# Patient Record
Sex: Female | Born: 1955 | Race: White | Hispanic: No | Smoking: Never smoker
Health system: Southern US, Community
[De-identification: ages and names within clinical notes are randomized; demographics above are authoritative.]

## PROBLEM LIST (undated history)

## (undated) DIAGNOSIS — E785 Hyperlipidemia, unspecified: Secondary | ICD-10-CM

## (undated) DIAGNOSIS — R011 Cardiac murmur, unspecified: Secondary | ICD-10-CM

## (undated) DIAGNOSIS — R202 Paresthesia of skin: Secondary | ICD-10-CM

## (undated) DIAGNOSIS — F32A Depression, unspecified: Secondary | ICD-10-CM

## (undated) DIAGNOSIS — I10 Essential (primary) hypertension: Secondary | ICD-10-CM

## (undated) DIAGNOSIS — R200 Anesthesia of skin: Secondary | ICD-10-CM

## (undated) DIAGNOSIS — E781 Pure hyperglyceridemia: Secondary | ICD-10-CM

## (undated) DIAGNOSIS — E039 Hypothyroidism, unspecified: Secondary | ICD-10-CM

## (undated) DIAGNOSIS — R7303 Prediabetes: Secondary | ICD-10-CM

## (undated) DIAGNOSIS — F329 Major depressive disorder, single episode, unspecified: Secondary | ICD-10-CM

## (undated) HISTORY — DX: Cardiac murmur, unspecified: R01.1

## (undated) HISTORY — DX: Prediabetes: R73.03

## (undated) HISTORY — DX: Hyperlipidemia, unspecified: E78.5

## (undated) HISTORY — DX: Anesthesia of skin: R20.2

## (undated) HISTORY — PX: COLONOSCOPY: SHX174

## (undated) HISTORY — DX: Hypothyroidism, unspecified: E03.9

## (undated) HISTORY — DX: Major depressive disorder, single episode, unspecified: F32.9

## (undated) HISTORY — DX: Anesthesia of skin: R20.0

## (undated) HISTORY — DX: Pure hyperglyceridemia: E78.1

## (undated) HISTORY — DX: Essential (primary) hypertension: I10

## (undated) HISTORY — DX: Depression, unspecified: F32.A

---
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
## 1997-10-12 ENCOUNTER — Other Ambulatory Visit: Admission: RE | Admit: 1997-10-12 | Discharge: 1997-10-12 | Payer: Self-pay | Admitting: Gynecology

## 1997-10-13 ENCOUNTER — Other Ambulatory Visit: Admission: RE | Admit: 1997-10-13 | Discharge: 1997-10-13 | Payer: Self-pay | Admitting: Gynecology

## 1997-12-29 ENCOUNTER — Encounter: Payer: Self-pay | Admitting: Gynecology

## 1997-12-29 ENCOUNTER — Ambulatory Visit (HOSPITAL_COMMUNITY): Admission: RE | Admit: 1997-12-29 | Discharge: 1997-12-29 | Payer: Self-pay | Admitting: Gynecology

## 1999-02-28 ENCOUNTER — Encounter: Payer: Self-pay | Admitting: Emergency Medicine

## 1999-03-01 ENCOUNTER — Inpatient Hospital Stay (HOSPITAL_COMMUNITY): Admission: AD | Admit: 1999-03-01 | Discharge: 1999-03-02 | Payer: Self-pay | Admitting: Cardiology

## 1999-03-01 ENCOUNTER — Encounter: Payer: Self-pay | Admitting: Cardiology

## 1999-03-02 ENCOUNTER — Encounter: Payer: Self-pay | Admitting: Cardiology

## 2001-04-01 ENCOUNTER — Other Ambulatory Visit: Admission: RE | Admit: 2001-04-01 | Discharge: 2001-04-01 | Payer: Self-pay | Admitting: Gynecology

## 2002-05-18 ENCOUNTER — Other Ambulatory Visit: Admission: RE | Admit: 2002-05-18 | Discharge: 2002-05-18 | Payer: Self-pay | Admitting: Gynecology

## 2003-06-06 ENCOUNTER — Other Ambulatory Visit: Admission: RE | Admit: 2003-06-06 | Discharge: 2003-06-06 | Payer: Self-pay | Admitting: Gynecology

## 2004-07-25 ENCOUNTER — Other Ambulatory Visit: Admission: RE | Admit: 2004-07-25 | Discharge: 2004-07-25 | Payer: Self-pay | Admitting: Gynecology

## 2005-08-05 ENCOUNTER — Other Ambulatory Visit: Admission: RE | Admit: 2005-08-05 | Discharge: 2005-08-05 | Payer: Self-pay | Admitting: Gynecology

## 2006-08-06 ENCOUNTER — Other Ambulatory Visit: Admission: RE | Admit: 2006-08-06 | Discharge: 2006-08-06 | Payer: Self-pay | Admitting: Gynecology

## 2007-03-26 ENCOUNTER — Ambulatory Visit: Payer: Self-pay | Admitting: Internal Medicine

## 2007-04-09 ENCOUNTER — Ambulatory Visit: Payer: Self-pay | Admitting: Internal Medicine

## 2007-09-29 ENCOUNTER — Other Ambulatory Visit: Admission: RE | Admit: 2007-09-29 | Discharge: 2007-09-29 | Payer: Self-pay | Admitting: Gynecology

## 2009-02-02 ENCOUNTER — Ambulatory Visit (HOSPITAL_COMMUNITY): Admission: RE | Admit: 2009-02-02 | Discharge: 2009-02-02 | Payer: Self-pay | Admitting: Urology

## 2010-04-07 LAB — CREATININE, SERUM
Creatinine, Ser: 0.88 mg/dL (ref 0.4–1.2)
GFR calc Af Amer: >60 mL/min (ref >60)
GFR calc non Af Amer: >60 mL/min (ref >60)

## 2010-06-07 NOTE — Discharge Summary (Signed)
[place]. [facility]  Patient:    Julia Hubbard, Julia Hubbard                     MRN: [id number] Adm. Date:  [id number] Disc. Date: [id number] Attending:  Robynn Pane CC:         Eduardo Osier. Sharyn Lull, M.D.                           Discharge Summary  ADMISSION DIAGNOSES: 1. Chest pain with mildly elevated troponin I, rule out questionable recent    small non-Q-wave myocardial infarction. 2. Depression. 3. Postmenopausal.  FINAL DIAGNOSES: 1. Chest pain with mildly elevated troponin I, possible recent small    non-Q-wave myocardial infarction. 2. Depression. 3. Postmenopausal. 4. Questionable possible recent small myocardial infarction.  DISCHARGE HOME MEDICATIONS: 1. Pepcid 20 mg 1 tablet twice daily. 2. Enteric-coated aspirin 1 tablet daily. 3. Effexor 75 mg p.o. b.i.d. 4. Pamelor 25 mg p.o. q.d. 5. Prempro 1 tablet daily. 6. Nitrostat sublingual 0.4 mg as needed.  CONDITION ON DISCHARGE:  Stable.  ACTIVITY:  As tolerated.  DIET:  Low salt, low cholesterol.  FOLLOW-UP:  With me in two weeks.  HISTORY OF PRESENT ILLNESS:  Ms. Julia Hubbard is a 55 year old white female with past medical history significant for depression.  Came to ER via EMS complaining of retrosternal chest pain radiating to back associated with diaphoresis and tingling in both hands and nausea.  The pain was sharp in nature, but was not related to breathing or movement.  Denies any relation of pain to food.  States had similar pain on Sunday which lasted all day, but did not seek any medical attention.  Denies palpitations, shortness of breath, lightheadedness, or syncope.  The patient denies any exertional chest pain. States she exercises at [facility] without any chest pain.  Denies fevers, chills, cough.  Denies abdominal pain.  PAST MEDICAL HISTORY:  No history of diabetes.  No history of hypertension, MI, no history of heart failure.  No history of peptic ulcer disease,  asthma.  PAST SURGICAL HISTORY:  She had tonsillectomy at the age of 87.  ALLERGIES:  No known drug allergies.  MEDICATIONS: 1. Effexor 75 mg p.o. b.i.d. 2. Pamelor 25 mg p.o. q.d. 3. Prempro 1 tablet daily.  SOCIAL HISTORY:  She is separated.  Drinks one to two glasses of wine socially.  No history of alcohol abuse.  She is a Archivist for speech pathology.  Born in [place], [place].  Lives in [place] for the last 22 years.  FAMILY HISTORY:  Father is 26.  He had colon cancer.  Mother is 37.  She is hypertensive.  She has three sisters in good health.  PHYSICAL EXAMINATION:  GENERAL:  The patient is alert, awake, oriented x 3.  In no acute distress.  VITAL SIGNS:  Hemodynamically stable.  HEENT:  Conjunctivae pink.  NECK:  Supple.  No JVD, no bruit.  LUNGS:  Clear to auscultation.  Without rhonchi or rales.  CARDIOVASCULAR:  S1, S2 was normal.  There was no S3, gallop, or murmur.  ABDOMEN:  Soft.  Bowel sounds are present.  Nontender.  There was no mass.  EXTREMITIES:  There was no clubbing, cyanosis, or edema.  LABORATORY DATA:  EKG showed normal sinus rhythm with no acute ischemic changes.  Chest x-ray showed no evidence of active disease.  Two sets of CPK, CK was 106,  MB of 0.7, with relative index of 0.7.  Second set 99 CK, MB of 0.8, with relative index of 0.8.  Troponin:  Both sets were slightly elevated at 0.14, second set was 0.12.  HOSPITAL COURSE:  The patient was admitted to telemetry unit.  The patient did not have any further episodes of chest pain during the hospital stay.  Her CKs, two sets were negative, although troponin was slightly elevated.  There was no evidence of recent chest trauma or any history of recent flu, but she did have chest pain approximately four or five days ago prior to this admission.  The patients EKGs remained essentially normal during the hospital stay.  The patient underwent stress Cardiolite on March 01, 1999, exercised for 8.58 minutes on Bruce protocol, had no chest pain.  The patient remained hemodynamically stable.  Peak heart rate was 149, peak blood pressure was 170/98.  EKG showed no evidence of ischemia at the peak of exercise or in the recovery phase.  Cardiolite scan also showed no evidence of ischemia.  The patient was discharged home in stable condition on the above medications, and will be followed up in my office in one to two weeks. DD:  04/10/99 TD:  04/10/99 Job: [id number]

## 2014-02-23 ENCOUNTER — Encounter: Payer: Self-pay | Admitting: Internal Medicine

## 2014-03-21 ENCOUNTER — Ambulatory Visit (AMBULATORY_SURGERY_CENTER): Payer: Self-pay

## 2014-03-21 VITALS — Ht 68.0 in | Wt 199.0 lb

## 2014-03-21 DIAGNOSIS — Z800 Family history of malignant neoplasm of digestive organs: Secondary | ICD-10-CM

## 2014-03-21 MED ORDER — MOVIPREP 100 G PO SOLR
ORAL | Status: DC
Start: 2014-03-21 — End: 2014-04-07

## 2014-03-21 NOTE — Progress Notes (Signed)
Per pt, no allergies to soy or egg products.Pt not taking any weight loss meds or using  O2 at home. 

## 2014-04-07 ENCOUNTER — Ambulatory Visit (AMBULATORY_SURGERY_CENTER): Payer: BLUE CROSS/BLUE SHIELD | Admitting: Internal Medicine

## 2014-04-07 ENCOUNTER — Encounter: Payer: Self-pay | Admitting: Internal Medicine

## 2014-04-07 VITALS — BP 105/65 | HR 57 | Temp 98.5°F | Resp 16 | Ht 68.0 in | Wt 199.0 lb

## 2014-04-07 DIAGNOSIS — Z800 Family history of malignant neoplasm of digestive organs: Secondary | ICD-10-CM

## 2014-04-07 DIAGNOSIS — D125 Benign neoplasm of sigmoid colon: Secondary | ICD-10-CM

## 2014-04-07 DIAGNOSIS — Z1211 Encounter for screening for malignant neoplasm of colon: Secondary | ICD-10-CM

## 2014-04-07 MED ORDER — SODIUM CHLORIDE 0.9 % IV SOLN: 500.0000 mL | INTRAVENOUS | Status: DC

## 2014-04-07 NOTE — Progress Notes (Signed)
Called to room to assist during endoscopic procedure.  Patient ID and intended procedure confirmed with present staff. Received instructions for my participation in the procedure from the performing physician.  

## 2014-04-07 NOTE — Progress Notes (Signed)
Report to PACU, RN, vss, BBS= Clear.  

## 2014-04-07 NOTE — Op Note (Signed)
[place]  [employer]. [place], [id number]   COLONOSCOPY PROCEDURE REPORT  PATIENT: Julia Hubbard, Julia Hubbard  MR#: [id number] BIRTHDATE: May 25, 1955 , 58  yrs. old GENDER: female ENDOSCOPIST: Lafayette Dragon, MD REFERRED ZC:HYIFO Addison Lank, M.D. PROCEDURE DATE:  04/07/2014 PROCEDURE:   Colonoscopy, screening and Colonoscopy with cold biopsy polypectomy First Screening Colonoscopy - Avg.  risk and is 50 yrs.  old or older - No.  Prior Negative Screening - Now for repeat screening. Less than 10 yrs Prior Negative Screening - Now for repeat screening.  Above average risk  History of Adenoma - Now for follow-up colonoscopy & has been > or = to 3 yrs.  N/A ASA CLASS:   Class I INDICATIONS:Screening for colonic neoplasia and FH Colon or Rectal Adenocarcinoma. ,father with colon cancer. Paternal grandfather and aunt with colon cancer area prior colonoscopies in 2002 and in March 2009 MEDICATIONS: Monitored anesthesia care and Propofol 300 mg IV  DESCRIPTION OF PROCEDURE:   After the risks benefits and alternatives of the procedure were thoroughly explained, informed consent was obtained.  The digital rectal exam revealed no abnormalities of the rectum.   The LB [id number]  endoscope was introduced through the anus and advanced to the cecum, which was identified by both the appendix and ileocecal valve. No adverse events experienced.   The quality of the prep was good.  (MoviPrep was used)  The instrument was then slowly withdrawn as the colon was fully examined.      COLON FINDINGS: A polypoid shaped sessile polyp measuring 7 mm in size was found in the sigmoid colon.  A polypectomy was performed with cold forceps.  The resection was complete, the polyp tissue was completely retrieved and sent to histology.  Retroflexed views revealed no abnormalities. The time to cecum = 5.38 Withdrawal time = 8.19   The scope was withdrawn and the procedure  completed. COMPLICATIONS: There were no immediate complications.  ENDOSCOPIC IMPRESSION: Sessile polyp was found in the sigmoid colon; polypectomy was performed with cold forceps  RECOMMENDATIONS: 1.  Await pathology results 2.  High-fiber diet Recall colonoscopy pending path report  eSigned:  Lafayette Dragon, MD 04/07/2014 8:30 AM   cc:

## 2014-04-07 NOTE — Patient Instructions (Signed)
YOU HAD AN ENDOSCOPIC PROCEDURE TODAY AT [facility]:   Refer to the procedure report that was given to you for any specific questions about what was found during the examination.  If the procedure report does not answer your questions, please call your gastroenterologist to clarify.  If you requested that your care partner not be given the details of your procedure findings, then the procedure report has been included in a sealed envelope for you to review at your convenience later.  YOU SHOULD EXPECT: Some feelings of bloating in the abdomen. Passage of more gas than usual.  Walking can help get rid of the air that was put into your GI tract during the procedure and reduce the bloating. If you had a lower endoscopy (such as a colonoscopy or flexible sigmoidoscopy) you may notice spotting of blood in your stool or on the toilet paper. If you underwent a bowel prep for your procedure, you may not have a normal bowel movement for a few days.  Please Note:  You might notice some irritation and congestion in your nose or some drainage.  This is from the oxygen used during your procedure.  There is no need for concern and it should clear up in a day or so.  SYMPTOMS TO REPORT IMMEDIATELY:   Following lower endoscopy (colonoscopy or flexible sigmoidoscopy):  Excessive amounts of blood in the stool  Significant tenderness or worsening of abdominal pains  Swelling of the abdomen that is new, acute  Fever of 100F or higher  For urgent or emergent issues, a gastroenterologist can be reached at any hour by calling [phone].   DIET: Your first meal following the procedure should be a small meal and then it is ok to progress to your normal diet. Heavy or fried foods are harder to digest and may make you feel nauseous or bloated.  Likewise, meals heavy in dairy and vegetables can increase bloating.  Drink plenty of fluids but you should avoid alcoholic beverages for 24  hours.  ACTIVITY:  You should plan to take it easy for the rest of today and you should NOT DRIVE or use heavy machinery until tomorrow (because of the sedation medicines used during the test).    FOLLOW UP: Our staff will call the number listed on your records the next business day following your procedure to check on you and address any questions or concerns that you may have regarding the information given to you following your procedure. If we do not reach you, we will leave a message.  However, if you are feeling well and you are not experiencing any problems, there is no need to return our call.  We will assume that you have returned to your regular daily activities without incident.  If any biopsies were taken you will be contacted by phone or by letter within the next 1-3 weeks.  Please call us at [phone] if you have not heard about the biopsies in 3 weeks.    SIGNATURES/CONFIDENTIALITY: You and/or your care partner have signed paperwork which will be entered into your electronic medical record.  These signatures attest to the fact that that the information above on your After Visit Summary has been reviewed and is understood.  Full responsibility of the confidentiality of this discharge information lies with you and/or your care-partner.  Await pathology  Continue your normal medications  Please read over handouts about polyps and high fiber diets  Please follow a high fiber diet

## 2014-04-10 ENCOUNTER — Telehealth: Payer: Self-pay | Admitting: *Deleted

## 2014-04-10 NOTE — Telephone Encounter (Signed)
  Follow up Call-  Call back number 04/07/2014  Post procedure Call Back phone  # [phone]  Permission to leave phone message Yes     Patient questions:  Do you have a fever, pain , or abdominal swelling? No. Pain Score  0 *  Have you tolerated food without any problems? Yes.    Have you been able to return to your normal activities? Yes.    Do you have any questions about your discharge instructions: Diet   No. Medications  No. Follow up visit  No.  Do you have questions or concerns about your Care? No.  Actions: * If pain score is 4 or above: No action needed, pain <4.

## 2014-04-11 ENCOUNTER — Encounter: Payer: Self-pay | Admitting: Internal Medicine

## 2015-03-16 ENCOUNTER — Other Ambulatory Visit (HOSPITAL_COMMUNITY): Payer: Self-pay | Admitting: Otolaryngology

## 2015-03-16 DIAGNOSIS — E079 Disorder of thyroid, unspecified: Secondary | ICD-10-CM

## 2015-03-21 ENCOUNTER — Ambulatory Visit (HOSPITAL_COMMUNITY)
Admission: RE | Admit: 2015-03-21 | Discharge: 2015-03-21 | Disposition: A | Discharge status: Home / Self Care | Payer: BLUE CROSS/BLUE SHIELD | Source: Ambulatory Visit | Attending: Otolaryngology | Admitting: Otolaryngology

## 2015-03-21 DIAGNOSIS — E079 Disorder of thyroid, unspecified: Secondary | ICD-10-CM

## 2015-03-28 ENCOUNTER — Other Ambulatory Visit (HOSPITAL_COMMUNITY): Payer: Self-pay | Admitting: Otolaryngology

## 2015-03-28 DIAGNOSIS — E041 Nontoxic single thyroid nodule: Secondary | ICD-10-CM

## 2015-04-06 ENCOUNTER — Ambulatory Visit (HOSPITAL_COMMUNITY): Admission: RE | Admit: 2015-04-06 | Payer: BLUE CROSS/BLUE SHIELD | Source: Ambulatory Visit

## 2015-07-02 ENCOUNTER — Ambulatory Visit: Payer: BLUE CROSS/BLUE SHIELD | Admitting: Gastroenterology

## 2015-09-26 DIAGNOSIS — Z0000 Encounter for general adult medical examination without abnormal findings: Secondary | ICD-10-CM | POA: Diagnosis not present

## 2015-09-26 DIAGNOSIS — R238 Other skin changes: Secondary | ICD-10-CM | POA: Diagnosis not present

## 2015-09-26 DIAGNOSIS — F331 Major depressive disorder, recurrent, moderate: Secondary | ICD-10-CM | POA: Diagnosis not present

## 2015-09-26 DIAGNOSIS — I10 Essential (primary) hypertension: Secondary | ICD-10-CM | POA: Diagnosis not present

## 2015-09-26 DIAGNOSIS — E782 Mixed hyperlipidemia: Secondary | ICD-10-CM | POA: Diagnosis not present

## 2015-10-17 DIAGNOSIS — Z23 Encounter for immunization: Secondary | ICD-10-CM | POA: Diagnosis not present

## 2015-11-13 ENCOUNTER — Other Ambulatory Visit: Payer: Self-pay | Admitting: Family Medicine

## 2015-11-13 DIAGNOSIS — Z23 Encounter for immunization: Secondary | ICD-10-CM | POA: Diagnosis not present

## 2015-11-13 DIAGNOSIS — Z01419 Encounter for gynecological examination (general) (routine) without abnormal findings: Secondary | ICD-10-CM | POA: Diagnosis not present

## 2015-11-13 DIAGNOSIS — Z0000 Encounter for general adult medical examination without abnormal findings: Secondary | ICD-10-CM | POA: Diagnosis not present

## 2015-11-13 DIAGNOSIS — Z124 Encounter for screening for malignant neoplasm of cervix: Secondary | ICD-10-CM | POA: Diagnosis not present

## 2015-11-13 DIAGNOSIS — Z1211 Encounter for screening for malignant neoplasm of colon: Secondary | ICD-10-CM | POA: Diagnosis not present

## 2015-11-13 DIAGNOSIS — E079 Disorder of thyroid, unspecified: Secondary | ICD-10-CM | POA: Diagnosis not present

## 2015-11-13 DIAGNOSIS — Z1231 Encounter for screening mammogram for malignant neoplasm of breast: Secondary | ICD-10-CM

## 2015-11-21 DIAGNOSIS — F331 Major depressive disorder, recurrent, moderate: Secondary | ICD-10-CM | POA: Diagnosis not present

## 2015-11-21 DIAGNOSIS — Z6829 Body mass index (BMI) 29.0-29.9, adult: Secondary | ICD-10-CM | POA: Diagnosis not present

## 2015-11-21 DIAGNOSIS — E039 Hypothyroidism, unspecified: Secondary | ICD-10-CM | POA: Diagnosis not present

## 2015-11-21 DIAGNOSIS — E559 Vitamin D deficiency, unspecified: Secondary | ICD-10-CM | POA: Diagnosis not present

## 2015-12-11 ENCOUNTER — Ambulatory Visit: Payer: BLUE CROSS/BLUE SHIELD

## 2016-01-02 ENCOUNTER — Ambulatory Visit
Admission: RE | Admit: 2016-01-02 | Discharge: 2016-01-02 | Disposition: A | Discharge status: Home / Self Care | Payer: BLUE CROSS/BLUE SHIELD | Source: Ambulatory Visit | Attending: Family Medicine | Admitting: Family Medicine

## 2016-01-02 DIAGNOSIS — Z1231 Encounter for screening mammogram for malignant neoplasm of breast: Secondary | ICD-10-CM | POA: Diagnosis not present

## 2016-01-29 DIAGNOSIS — E039 Hypothyroidism, unspecified: Secondary | ICD-10-CM | POA: Diagnosis not present

## 2016-01-29 DIAGNOSIS — E559 Vitamin D deficiency, unspecified: Secondary | ICD-10-CM | POA: Diagnosis not present

## 2016-01-31 DIAGNOSIS — E559 Vitamin D deficiency, unspecified: Secondary | ICD-10-CM | POA: Diagnosis not present

## 2016-01-31 DIAGNOSIS — E039 Hypothyroidism, unspecified: Secondary | ICD-10-CM | POA: Diagnosis not present

## 2016-01-31 DIAGNOSIS — I10 Essential (primary) hypertension: Secondary | ICD-10-CM | POA: Diagnosis not present

## 2016-01-31 DIAGNOSIS — Z6831 Body mass index (BMI) 31.0-31.9, adult: Secondary | ICD-10-CM | POA: Diagnosis not present

## 2016-04-04 DIAGNOSIS — J302 Other seasonal allergic rhinitis: Secondary | ICD-10-CM | POA: Diagnosis not present

## 2016-08-05 DIAGNOSIS — E039 Hypothyroidism, unspecified: Secondary | ICD-10-CM | POA: Diagnosis not present

## 2016-08-07 DIAGNOSIS — I10 Essential (primary) hypertension: Secondary | ICD-10-CM | POA: Diagnosis not present

## 2016-08-07 DIAGNOSIS — E039 Hypothyroidism, unspecified: Secondary | ICD-10-CM | POA: Diagnosis not present

## 2016-08-07 DIAGNOSIS — R21 Rash and other nonspecific skin eruption: Secondary | ICD-10-CM | POA: Diagnosis not present

## 2016-08-07 DIAGNOSIS — F331 Major depressive disorder, recurrent, moderate: Secondary | ICD-10-CM | POA: Diagnosis not present

## 2016-10-16 DIAGNOSIS — Z23 Encounter for immunization: Secondary | ICD-10-CM | POA: Diagnosis not present

## 2016-11-14 DIAGNOSIS — Z1329 Encounter for screening for other suspected endocrine disorder: Secondary | ICD-10-CM | POA: Diagnosis not present

## 2016-11-14 DIAGNOSIS — Z114 Encounter for screening for human immunodeficiency virus [HIV]: Secondary | ICD-10-CM | POA: Diagnosis not present

## 2016-11-14 DIAGNOSIS — Z13220 Encounter for screening for lipoid disorders: Secondary | ICD-10-CM | POA: Diagnosis not present

## 2016-11-14 DIAGNOSIS — Z0000 Encounter for general adult medical examination without abnormal findings: Secondary | ICD-10-CM | POA: Diagnosis not present

## 2016-11-17 ENCOUNTER — Other Ambulatory Visit: Payer: Self-pay | Admitting: Family Medicine

## 2016-11-17 DIAGNOSIS — I10 Essential (primary) hypertension: Secondary | ICD-10-CM | POA: Diagnosis not present

## 2016-11-17 DIAGNOSIS — Z1211 Encounter for screening for malignant neoplasm of colon: Secondary | ICD-10-CM | POA: Diagnosis not present

## 2016-11-17 DIAGNOSIS — E559 Vitamin D deficiency, unspecified: Secondary | ICD-10-CM | POA: Diagnosis not present

## 2016-11-17 DIAGNOSIS — Z23 Encounter for immunization: Secondary | ICD-10-CM | POA: Diagnosis not present

## 2016-11-17 DIAGNOSIS — Z0000 Encounter for general adult medical examination without abnormal findings: Secondary | ICD-10-CM | POA: Diagnosis not present

## 2016-11-17 DIAGNOSIS — E039 Hypothyroidism, unspecified: Secondary | ICD-10-CM | POA: Diagnosis not present

## 2016-11-17 DIAGNOSIS — Z6830 Body mass index (BMI) 30.0-30.9, adult: Secondary | ICD-10-CM | POA: Diagnosis not present

## 2016-11-17 DIAGNOSIS — Z1231 Encounter for screening mammogram for malignant neoplasm of breast: Secondary | ICD-10-CM

## 2016-12-04 ENCOUNTER — Ambulatory Visit: Payer: BLUE CROSS/BLUE SHIELD

## 2016-12-09 ENCOUNTER — Ambulatory Visit
Admission: RE | Admit: 2016-12-09 | Discharge: 2016-12-09 | Disposition: A | Discharge status: Home / Self Care | Payer: BLUE CROSS/BLUE SHIELD | Source: Ambulatory Visit | Attending: Family Medicine | Admitting: Family Medicine

## 2016-12-09 DIAGNOSIS — Z1231 Encounter for screening mammogram for malignant neoplasm of breast: Secondary | ICD-10-CM

## 2016-12-16 DIAGNOSIS — Z23 Encounter for immunization: Secondary | ICD-10-CM | POA: Diagnosis not present

## 2017-01-22 DIAGNOSIS — J040 Acute laryngitis: Secondary | ICD-10-CM | POA: Diagnosis not present

## 2017-01-22 DIAGNOSIS — J45909 Unspecified asthma, uncomplicated: Secondary | ICD-10-CM | POA: Diagnosis not present

## 2017-01-22 DIAGNOSIS — J029 Acute pharyngitis, unspecified: Secondary | ICD-10-CM | POA: Diagnosis not present

## 2017-01-22 DIAGNOSIS — J069 Acute upper respiratory infection, unspecified: Secondary | ICD-10-CM | POA: Diagnosis not present

## 2017-02-03 DIAGNOSIS — R946 Abnormal results of thyroid function studies: Secondary | ICD-10-CM | POA: Diagnosis not present

## 2017-02-03 DIAGNOSIS — E559 Vitamin D deficiency, unspecified: Secondary | ICD-10-CM | POA: Diagnosis not present

## 2017-02-12 DIAGNOSIS — Z6830 Body mass index (BMI) 30.0-30.9, adult: Secondary | ICD-10-CM | POA: Diagnosis not present

## 2017-02-12 DIAGNOSIS — J069 Acute upper respiratory infection, unspecified: Secondary | ICD-10-CM | POA: Diagnosis not present

## 2017-02-12 DIAGNOSIS — E039 Hypothyroidism, unspecified: Secondary | ICD-10-CM | POA: Diagnosis not present

## 2017-02-12 DIAGNOSIS — E559 Vitamin D deficiency, unspecified: Secondary | ICD-10-CM | POA: Diagnosis not present

## 2017-03-13 DIAGNOSIS — R21 Rash and other nonspecific skin eruption: Secondary | ICD-10-CM | POA: Diagnosis not present

## 2017-03-13 DIAGNOSIS — B354 Tinea corporis: Secondary | ICD-10-CM | POA: Diagnosis not present

## 2017-03-13 DIAGNOSIS — Z6831 Body mass index (BMI) 31.0-31.9, adult: Secondary | ICD-10-CM | POA: Diagnosis not present

## 2017-06-03 DIAGNOSIS — I10 Essential (primary) hypertension: Secondary | ICD-10-CM | POA: Diagnosis not present

## 2017-06-08 DIAGNOSIS — E559 Vitamin D deficiency, unspecified: Secondary | ICD-10-CM | POA: Diagnosis not present

## 2017-06-08 DIAGNOSIS — E039 Hypothyroidism, unspecified: Secondary | ICD-10-CM | POA: Diagnosis not present

## 2017-06-10 DIAGNOSIS — Z23 Encounter for immunization: Secondary | ICD-10-CM | POA: Diagnosis not present

## 2017-06-10 DIAGNOSIS — Z6829 Body mass index (BMI) 29.0-29.9, adult: Secondary | ICD-10-CM | POA: Diagnosis not present

## 2017-06-10 DIAGNOSIS — E559 Vitamin D deficiency, unspecified: Secondary | ICD-10-CM | POA: Diagnosis not present

## 2017-06-10 DIAGNOSIS — I10 Essential (primary) hypertension: Secondary | ICD-10-CM | POA: Diagnosis not present

## 2017-06-10 DIAGNOSIS — E039 Hypothyroidism, unspecified: Secondary | ICD-10-CM | POA: Diagnosis not present

## 2017-07-27 DIAGNOSIS — H00012 Hordeolum externum right lower eyelid: Secondary | ICD-10-CM | POA: Diagnosis not present

## 2017-10-13 DIAGNOSIS — Z23 Encounter for immunization: Secondary | ICD-10-CM | POA: Diagnosis not present

## 2017-11-12 ENCOUNTER — Other Ambulatory Visit: Payer: Self-pay | Admitting: Family Medicine

## 2017-11-12 DIAGNOSIS — Z1231 Encounter for screening mammogram for malignant neoplasm of breast: Secondary | ICD-10-CM

## 2017-11-18 DIAGNOSIS — Z1329 Encounter for screening for other suspected endocrine disorder: Secondary | ICD-10-CM | POA: Diagnosis not present

## 2017-11-18 DIAGNOSIS — Z13220 Encounter for screening for lipoid disorders: Secondary | ICD-10-CM | POA: Diagnosis not present

## 2017-11-18 DIAGNOSIS — Z0000 Encounter for general adult medical examination without abnormal findings: Secondary | ICD-10-CM | POA: Diagnosis not present

## 2017-11-18 DIAGNOSIS — E559 Vitamin D deficiency, unspecified: Secondary | ICD-10-CM | POA: Diagnosis not present

## 2017-11-18 DIAGNOSIS — Z114 Encounter for screening for human immunodeficiency virus [HIV]: Secondary | ICD-10-CM | POA: Diagnosis not present

## 2017-11-20 DIAGNOSIS — Z23 Encounter for immunization: Secondary | ICD-10-CM | POA: Diagnosis not present

## 2017-11-20 DIAGNOSIS — Z6829 Body mass index (BMI) 29.0-29.9, adult: Secondary | ICD-10-CM | POA: Diagnosis not present

## 2017-11-20 DIAGNOSIS — Z1211 Encounter for screening for malignant neoplasm of colon: Secondary | ICD-10-CM | POA: Diagnosis not present

## 2017-11-20 DIAGNOSIS — Z0000 Encounter for general adult medical examination without abnormal findings: Secondary | ICD-10-CM | POA: Diagnosis not present

## 2017-11-20 DIAGNOSIS — E559 Vitamin D deficiency, unspecified: Secondary | ICD-10-CM | POA: Diagnosis not present

## 2017-12-10 ENCOUNTER — Ambulatory Visit
Admission: RE | Admit: 2017-12-10 | Discharge: 2017-12-10 | Disposition: A | Discharge status: Home / Self Care | Payer: BLUE CROSS/BLUE SHIELD | Source: Ambulatory Visit | Attending: Family Medicine | Admitting: Family Medicine

## 2017-12-10 DIAGNOSIS — Z1231 Encounter for screening mammogram for malignant neoplasm of breast: Secondary | ICD-10-CM

## 2018-01-26 DIAGNOSIS — E039 Hypothyroidism, unspecified: Secondary | ICD-10-CM | POA: Diagnosis not present

## 2018-01-26 DIAGNOSIS — R946 Abnormal results of thyroid function studies: Secondary | ICD-10-CM | POA: Diagnosis not present

## 2018-01-29 DIAGNOSIS — Z6829 Body mass index (BMI) 29.0-29.9, adult: Secondary | ICD-10-CM | POA: Diagnosis not present

## 2018-01-29 DIAGNOSIS — E039 Hypothyroidism, unspecified: Secondary | ICD-10-CM | POA: Diagnosis not present

## 2018-01-29 DIAGNOSIS — R238 Other skin changes: Secondary | ICD-10-CM | POA: Diagnosis not present

## 2018-04-07 DIAGNOSIS — E039 Hypothyroidism, unspecified: Secondary | ICD-10-CM | POA: Diagnosis not present

## 2018-04-07 DIAGNOSIS — I10 Essential (primary) hypertension: Secondary | ICD-10-CM | POA: Diagnosis not present

## 2019-05-17 DIAGNOSIS — Z0000 Encounter for general adult medical examination without abnormal findings: Secondary | ICD-10-CM | POA: Diagnosis not present

## 2019-05-17 DIAGNOSIS — Z0184 Encounter for antibody response examination: Secondary | ICD-10-CM | POA: Diagnosis not present

## 2019-05-19 DIAGNOSIS — E782 Mixed hyperlipidemia: Secondary | ICD-10-CM | POA: Diagnosis not present

## 2019-05-19 DIAGNOSIS — E039 Hypothyroidism, unspecified: Secondary | ICD-10-CM | POA: Diagnosis not present

## 2019-05-19 DIAGNOSIS — Z1211 Encounter for screening for malignant neoplasm of colon: Secondary | ICD-10-CM | POA: Diagnosis not present

## 2019-05-19 DIAGNOSIS — Z01419 Encounter for gynecological examination (general) (routine) without abnormal findings: Secondary | ICD-10-CM | POA: Diagnosis not present

## 2019-05-19 DIAGNOSIS — R739 Hyperglycemia, unspecified: Secondary | ICD-10-CM | POA: Diagnosis not present

## 2019-05-19 DIAGNOSIS — I10 Essential (primary) hypertension: Secondary | ICD-10-CM | POA: Diagnosis not present

## 2019-05-19 DIAGNOSIS — Z0000 Encounter for general adult medical examination without abnormal findings: Secondary | ICD-10-CM | POA: Diagnosis not present

## 2019-05-19 DIAGNOSIS — Z6830 Body mass index (BMI) 30.0-30.9, adult: Secondary | ICD-10-CM | POA: Diagnosis not present

## 2019-06-01 ENCOUNTER — Other Ambulatory Visit: Payer: Self-pay | Admitting: Family Medicine

## 2019-06-01 DIAGNOSIS — Z1231 Encounter for screening mammogram for malignant neoplasm of breast: Secondary | ICD-10-CM

## 2019-06-06 ENCOUNTER — Ambulatory Visit: Payer: BLUE CROSS/BLUE SHIELD

## 2019-06-13 DIAGNOSIS — I10 Essential (primary) hypertension: Secondary | ICD-10-CM | POA: Diagnosis not present

## 2019-06-13 DIAGNOSIS — M170 Bilateral primary osteoarthritis of knee: Secondary | ICD-10-CM | POA: Diagnosis not present

## 2019-06-13 DIAGNOSIS — M19041 Primary osteoarthritis, right hand: Secondary | ICD-10-CM | POA: Diagnosis not present

## 2019-06-13 DIAGNOSIS — F331 Major depressive disorder, recurrent, moderate: Secondary | ICD-10-CM | POA: Diagnosis not present

## 2019-07-12 IMAGING — MG DIGITAL SCREENING BILATERAL MAMMOGRAM WITH TOMO AND CAD
6 of 10 series · 6 of 30 positions shown · non-contrast
Comparison: Previous exam(s).

ACR Breast Density Category a: The breast tissue is almost entirely
fatty.

CLINICAL DATA: Screening.

EXAM:
DIGITAL SCREENING BILATERAL MAMMOGRAM WITH TOMO AND CAD

[R CC synth-2D]
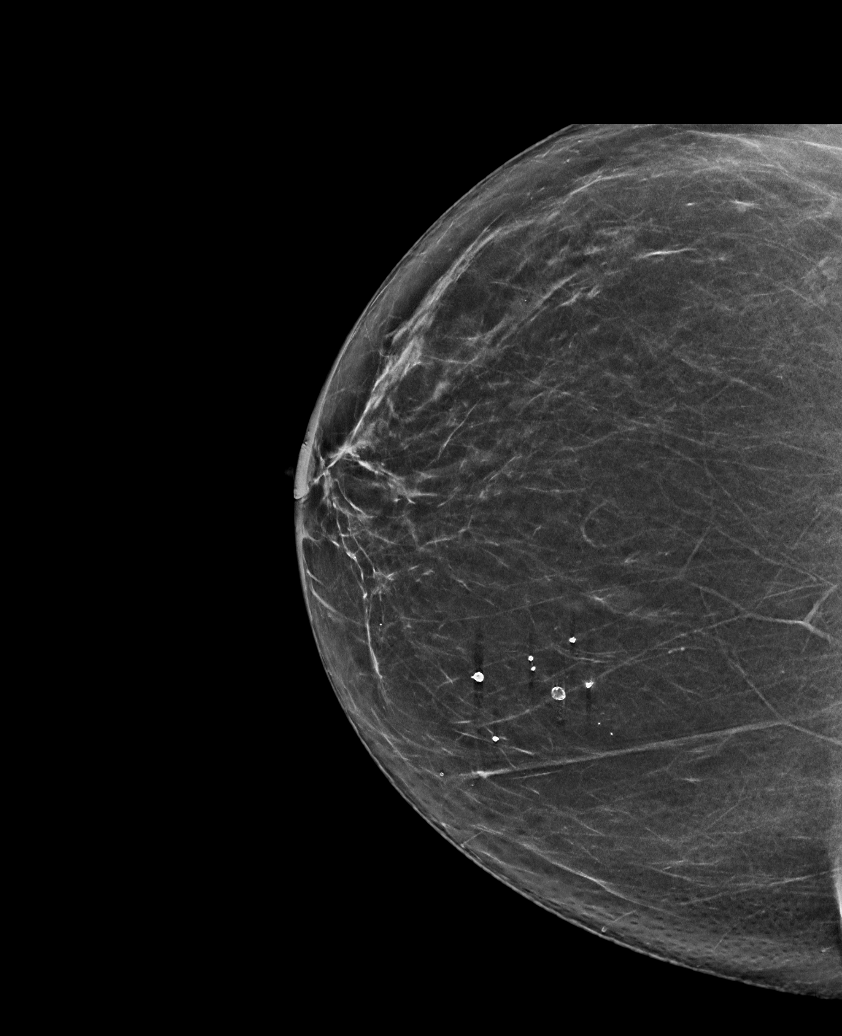

[L CC synth-2D]
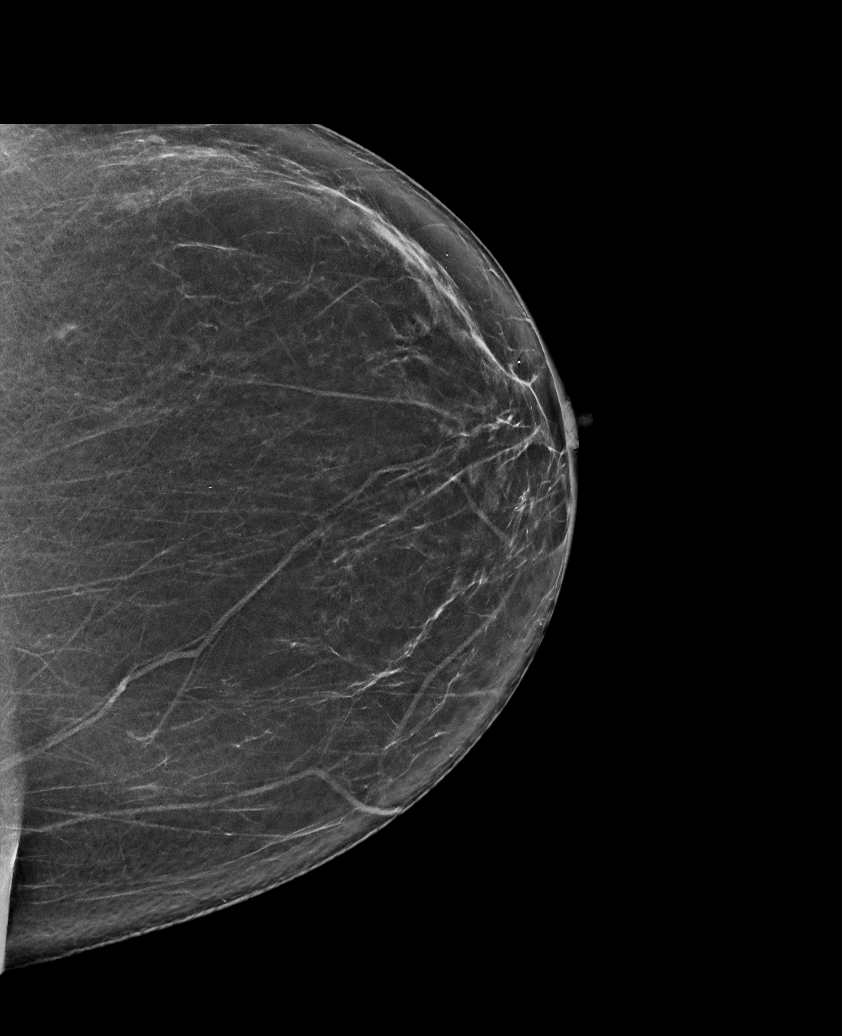

[R MLO synth-2D]
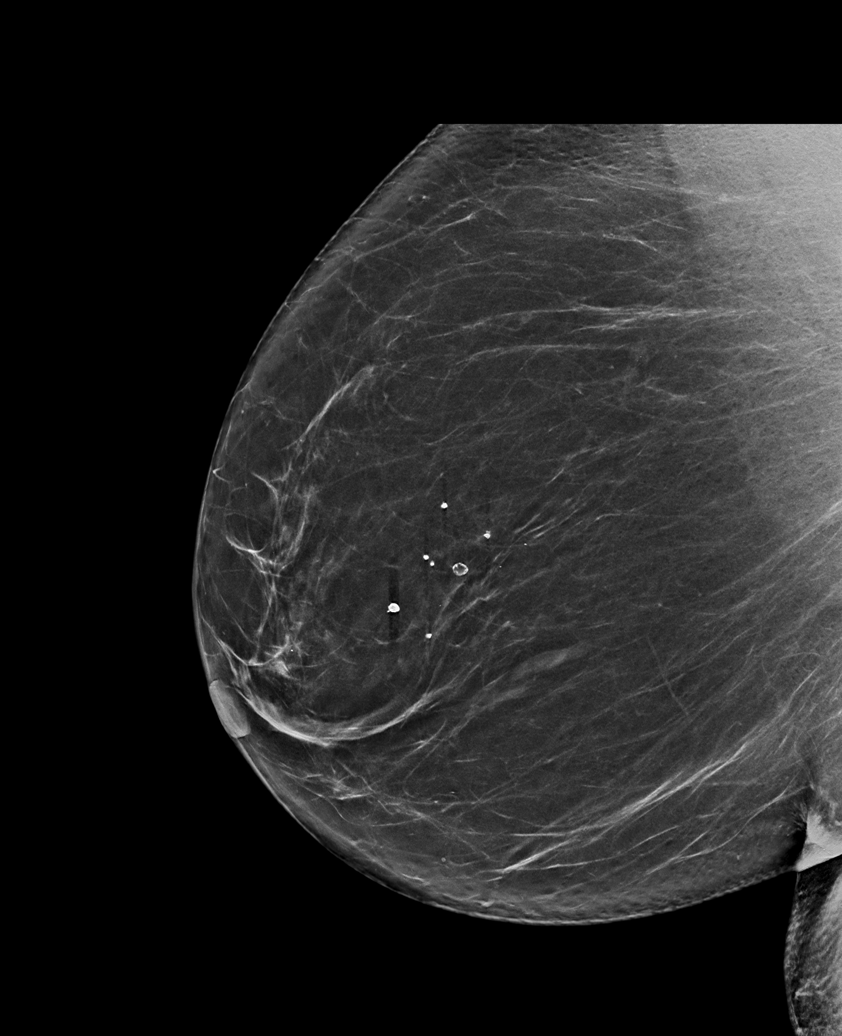

[L MLO synth-2D (1 of 2)]
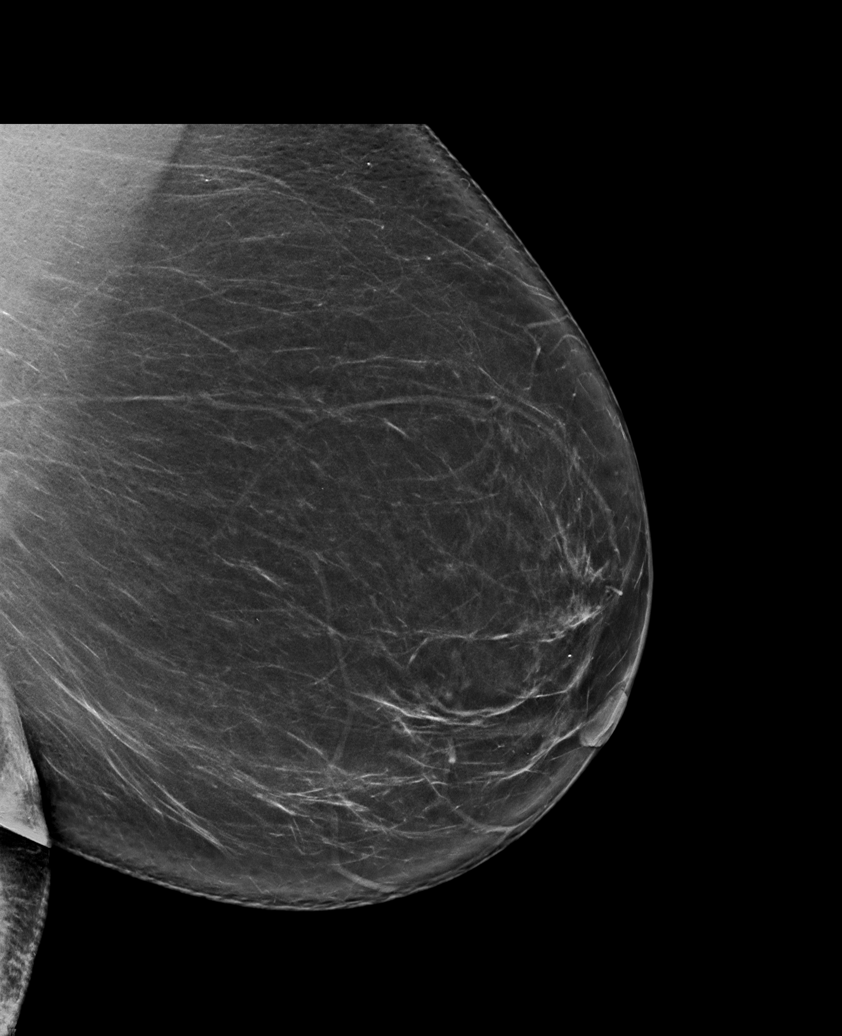

[L MLO synth-2D (2 of 2)]
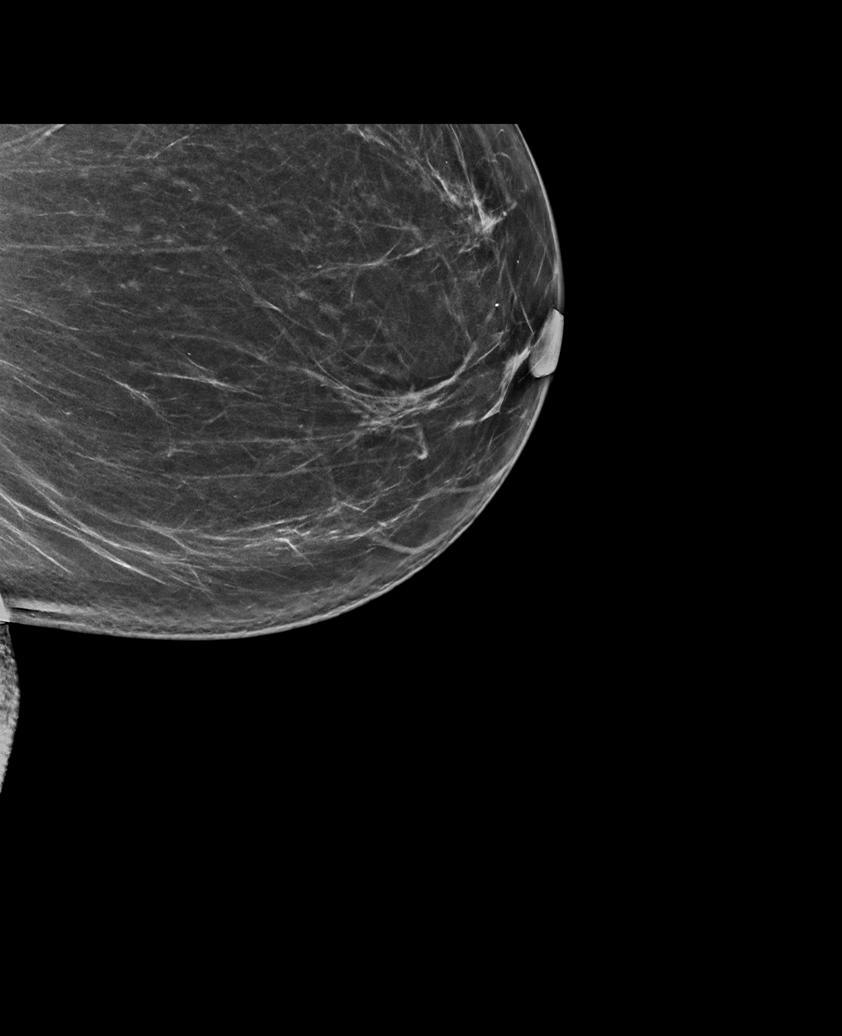

[R MLO tomo · tomo slice 44/87.0]
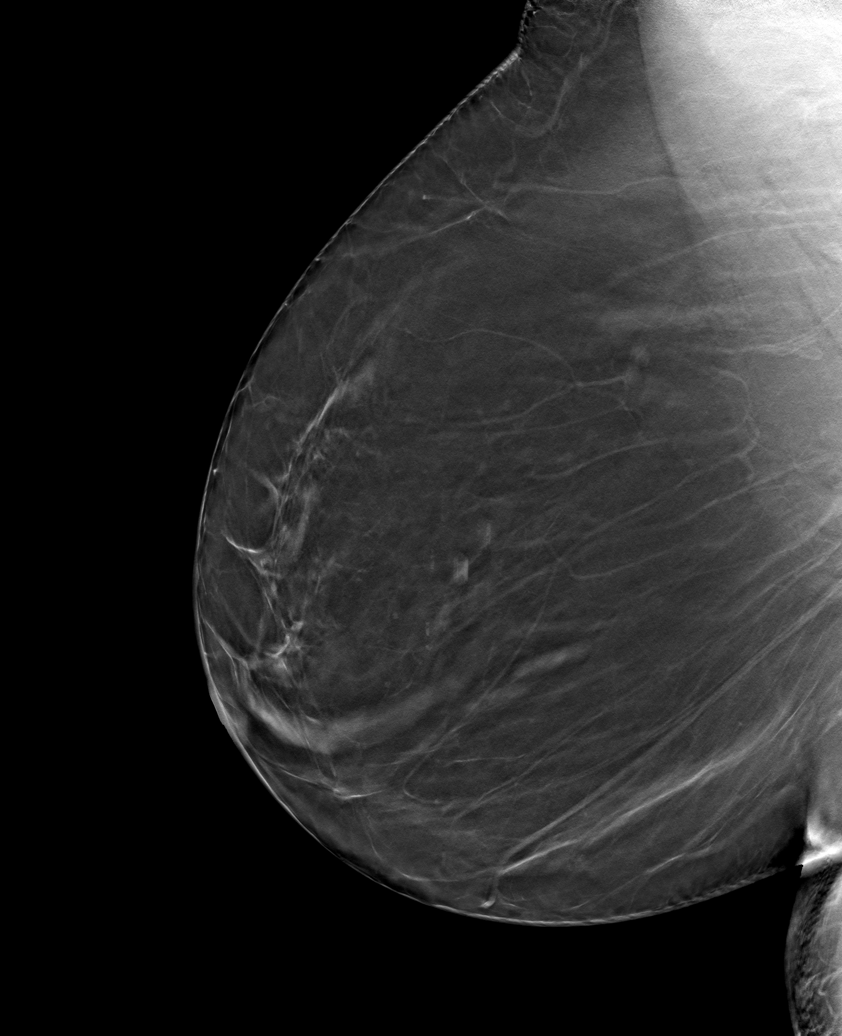

[6 of 30 positions shown; findings below may reference images not displayed]

FINDINGS: There are no findings suspicious for malignancy. Images were
processed with CAD.
IMPRESSION: No mammographic evidence of malignancy. A result letter of this
screening mammogram will be mailed directly to the patient.

RECOMMENDATION:
Screening mammogram in one year. (Code:[id number]-[id number])

BI-RADS CATEGORY  1: Negative.

## 2019-07-14 DIAGNOSIS — E782 Mixed hyperlipidemia: Secondary | ICD-10-CM | POA: Diagnosis not present

## 2019-07-14 DIAGNOSIS — E039 Hypothyroidism, unspecified: Secondary | ICD-10-CM | POA: Diagnosis not present

## 2019-07-18 DIAGNOSIS — I10 Essential (primary) hypertension: Secondary | ICD-10-CM | POA: Diagnosis not present

## 2019-07-18 DIAGNOSIS — E039 Hypothyroidism, unspecified: Secondary | ICD-10-CM | POA: Diagnosis not present

## 2019-07-18 DIAGNOSIS — E782 Mixed hyperlipidemia: Secondary | ICD-10-CM | POA: Diagnosis not present

## 2019-07-18 DIAGNOSIS — R7989 Other specified abnormal findings of blood chemistry: Secondary | ICD-10-CM | POA: Diagnosis not present

## 2019-08-05 ENCOUNTER — Other Ambulatory Visit: Payer: Self-pay

## 2019-08-05 ENCOUNTER — Ambulatory Visit (HOSPITAL_COMMUNITY)
Admission: EM | Admit: 2019-08-05 | Discharge: 2019-08-05 | Disposition: A | Discharge status: Home / Self Care | Payer: Self-pay | Attending: Emergency Medicine | Admitting: Emergency Medicine

## 2019-08-05 ENCOUNTER — Encounter (HOSPITAL_COMMUNITY): Payer: Self-pay | Admitting: Emergency Medicine

## 2019-08-05 DIAGNOSIS — H00025 Hordeolum internum left lower eyelid: Secondary | ICD-10-CM

## 2019-08-05 DIAGNOSIS — S161XXA Strain of muscle, fascia and tendon at neck level, initial encounter: Secondary | ICD-10-CM

## 2019-08-05 MED ORDER — ERYTHROMYCIN 5 MG/GM OP OINT
1.0000 | TOPICAL_OINTMENT | Freq: Four times a day (QID) | OPHTHALMIC | 0 refills | Status: AC
Start: 2019-08-05 — End: 2019-08-12
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
## 2019-08-05 NOTE — Discharge Instructions (Signed)
Exam is reassuring today. It would be expected to experience stiffness and soreness over the next few days with gradual improvement over the next few weeks.  Light and regular activity and stretching. Heat application while active to help with spasm or tightness.  Follow up with your PCP as needed for persistent symptoms.
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
## 2019-10-07 ENCOUNTER — Encounter: Payer: Self-pay | Admitting: Gastroenterology

## 2019-11-14 DIAGNOSIS — I10 Essential (primary) hypertension: Secondary | ICD-10-CM | POA: Diagnosis not present

## 2019-11-14 DIAGNOSIS — E782 Mixed hyperlipidemia: Secondary | ICD-10-CM | POA: Diagnosis not present

## 2019-11-14 DIAGNOSIS — E039 Hypothyroidism, unspecified: Secondary | ICD-10-CM | POA: Diagnosis not present

## 2019-11-17 DIAGNOSIS — I10 Essential (primary) hypertension: Secondary | ICD-10-CM | POA: Diagnosis not present

## 2019-11-17 DIAGNOSIS — E1165 Type 2 diabetes mellitus with hyperglycemia: Secondary | ICD-10-CM | POA: Diagnosis not present

## 2019-11-17 DIAGNOSIS — E782 Mixed hyperlipidemia: Secondary | ICD-10-CM | POA: Diagnosis not present

## 2019-11-17 DIAGNOSIS — E039 Hypothyroidism, unspecified: Secondary | ICD-10-CM | POA: Diagnosis not present

## 2019-12-02 ENCOUNTER — Encounter: Payer: Self-pay | Admitting: Gastroenterology

## 2020-04-10 ENCOUNTER — Other Ambulatory Visit: Payer: Self-pay | Admitting: Family Medicine

## 2020-04-10 DIAGNOSIS — Z1231 Encounter for screening mammogram for malignant neoplasm of breast: Secondary | ICD-10-CM

## 2020-06-07 ENCOUNTER — Other Ambulatory Visit: Payer: Self-pay

## 2020-06-07 ENCOUNTER — Ambulatory Visit
Admission: RE | Admit: 2020-06-07 | Discharge: 2020-06-07 | Disposition: A | Discharge status: Home / Self Care | Payer: 59 | Source: Ambulatory Visit | Attending: Family Medicine | Admitting: Family Medicine

## 2020-06-07 DIAGNOSIS — Z1231 Encounter for screening mammogram for malignant neoplasm of breast: Secondary | ICD-10-CM

## 2020-06-11 ENCOUNTER — Ambulatory Visit: Payer: Self-pay

## 2020-06-25 NOTE — Progress Notes (Signed)
Date:  06/26/2020   ID:  Julia Hubbard, DOB December 25, 1955, MRN [id number]  PCP:  Fanny Bien, MD  Cardiologist: Rex Kras, DO, [facility] (established care 06/26/2020)  REASON FOR CONSULT: Murmur  REQUESTING PHYSICIAN:  Fanny Bien, MD [address] [place] [id number]  Chief Complaint  Patient presents with  . Heart Murmur  . New Patient (Initial Visit)    HPI  Julia Hubbard is a 65 y.o. female who presents to the office with a chief complaint of " evaluation of heart murmur." Patient's past medical history and cardiovascular risk factors include: Hyperlipidemia, HTN, hypothyroidism, Prediabetes, hypertriglyceridemia, depression, obesity due to excess calorie.   She is referred to the office at the request of Fanny Bien, MD for evaluation of murmur.  Patient recently went for her yearly physical at her PCPs office and was noted to have a cardiac murmur and is referred to cardiology for further evaluation and management.  Review of systems are positive for lightheaded and dizziness predominantly with changing positions.    Approximately 2 weeks ago after coming home from work she was sitting on her patio chair reading a book when she got up she passed out she remembers holding onto something but still may have hit her head.  Since then no recurrence of episodes.  She states that day didn't have a meal and may have been dehydrated.   Recently has also noticed some decrease in physical endurance with her day-to-day activities and activities such as hiking.   FUNCTIONAL STATUS: Swims three days a week.    ALLERGIES: Allergies  Allergen Reactions  . Peanut-Containing Drug Products     Pt can eat peanuts but not certain NUTS! Airway swelling, and lips swell    MEDICATION LIST PRIOR TO VISIT: Current Meds  Medication Sig  . cholecalciferol (VITAMIN D) 1000 UNITS tablet Take 1,000 Units by mouth daily.  Marland Kitchen levothyroxine (SYNTHROID) 50 MCG tablet Take  1 tablet by mouth daily at 12 noon.  Marland Kitchen lisinopril-hydrochlorothiazide (PRINZIDE,ZESTORETIC) 20-12.5 MG per tablet Take 1 tablet by mouth daily.  . Multiple Vitamin (MULTIVITAMIN) tablet Take 1 tablet by mouth daily.  . rosuvastatin (CRESTOR) 20 MG tablet Take 1 tablet by mouth daily at 12 noon.  . venlafaxine XR (EFFEXOR-XR) 150 MG 24 hr capsule Take 150 mg by mouth daily with breakfast.  . vitamin B-12 (CYANOCOBALAMIN) 50 MCG tablet Take 50 mcg by mouth daily.  . [DISCONTINUED] venlafaxine (EFFEXOR) 75 MG tablet Take 75 mg by mouth daily.     PAST MEDICAL HISTORY: Past Medical History:  Diagnosis Date  . Depression   . Hyperlipidemia   . Hypertension   . Hypertriglyceridemia   . Hypothyroidism   . Numbness and tingling in hands    during sleep and laying down  . Prediabetes     PAST SURGICAL HISTORY: Past Surgical History:  Procedure Laterality Date  . TONSILLECTOMY  1977    FAMILY HISTORY: The patient family history includes Breast cancer in her maternal aunt; Colon cancer in her father and paternal grandfather.  SOCIAL HISTORY:  The patient  reports that she has never smoked. She has never used smokeless tobacco. She reports current alcohol use of about 1.0 standard drink of alcohol per week. She reports that she does not use drugs.  REVIEW OF SYSTEMS: Review of Systems  Constitutional: Negative for chills and fever.  HENT: Negative for hoarse voice and nosebleeds.   Eyes: Negative for discharge, double vision  and pain.  Cardiovascular: Positive for dyspnea on exertion and syncope. Negative for chest pain, claudication, leg swelling, near-syncope, orthopnea, palpitations and paroxysmal nocturnal dyspnea.  Respiratory: Positive for shortness of breath. Negative for hemoptysis.   Musculoskeletal: Negative for muscle cramps and myalgias.  Gastrointestinal: Negative for abdominal pain, constipation, diarrhea, hematemesis, hematochezia, melena, nausea and vomiting.   Neurological: Positive for dizziness and light-headedness.    PHYSICAL EXAM: Vitals with BMI 06/26/2020 08/05/2019 04/07/2014  Height _0  - -  Weight 207 lbs - -  BMI 27.51 - -  Systolic 700 174 944  Diastolic 71 73 65  Pulse 96 92 57   Orthostatic VS for the past 72 hrs (Last 3 readings):  Orthostatic BP Patient Position BP Location Cuff Size Orthostatic Pulse  06/26/20 1344 114/72 Standing Left Arm Large 76  06/26/20 1343 116/63 Sitting Left Arm Large 71  06/26/20 1342 126/64 Supine Left Arm Large 68    CONSTITUTIONAL: Well-developed and well-nourished. No acute distress.  SKIN: Skin is warm and dry. No rash noted. No cyanosis. No pallor. No jaundice HEAD: Normocephalic and atraumatic.  EYES: No scleral icterus MOUTH/THROAT: Moist oral membranes.  NECK: No JVD present. No thyromegaly noted. No carotid bruits  LYMPHATIC: No visible cervical adenopathy.  CHEST Normal respiratory effort. No intercostal retractions  LUNGS: Clear to auscultation bilaterally.  No stridor. No wheezes. No rales.  CARDIOVASCULAR: Regular, positive H6-P5, soft diastolic murmur heard over the left upper sternal border, no gallops or rubs. ABDOMINAL: Obese, soft, nontender, nondistended, positive bowel sounds in all 4 quadrants, no apparent ascites.  EXTREMITIES: No peripheral edema  HEMATOLOGIC: No significant bruising NEUROLOGIC: Oriented to person, place, and time. Nonfocal. Normal muscle tone.  PSYCHIATRIC: Normal mood and affect. Normal behavior. Cooperative  CARDIAC DATABASE: EKG: 06/26/2020: Sinus  Rhythm, 76 bpm, normal axis, without underlying injury pattern.  Echocardiogram: No results found for this or any previous visit from the past 1095 days.    Stress Testing: No results found for this or any previous visit from the past 1095 days.   Heart Catheterization: None   LABORATORY DATA: No flowsheet data found.  CMP Latest Ref Rng & Units 02/02/2009  Creatinine 0.4 - 1.2 mg/dL 0.88     Lipid Panel  No results found for: CHOL, TRIG, HDL, CHOLHDL, VLDL, LDLCALC, LDLDIRECT, LABVLDL  No components found for: NTPROBNP No results for input(s): PROBNP in the last 8760 hours. No results for input(s): TSH in the last 8760 hours.  BMP No results for input(s): NA, K, CL, CO2, GLUCOSE, BUN, CREATININE, CALCIUM, GFRNONAA, GFRAA in the last 8760 hours.  HEMOGLOBIN A1C No results found for: HGBA1C, MPG   External Labs:  Date Collected: 05/23/2020 , information obtained by PCP Potassium: 4.5 Creatinine 0.89 mg/dL. eGFR: 72 mL/min per 1.73 m Hemoglobin: 14.6 g/dL and hematocrit: 45.1 % Lipid profile: Total cholesterol 167 , triglycerides 239 , HDL 56 , LDL 72 AST: 36 , ALT: 38 , alkaline phosphatase: 64  Hemoglobin A1c: 6.0 TSH: 1.660   IMPRESSION:    ICD-10-CM   1. Murmur  R01.1 EKG 12-Lead    PCV ECHOCARDIOGRAM COMPLETE  2. Dyspnea on exertion  R06.00   3. Syncope and collapse  R55 LONG TERM MONITOR (3-14 DAYS)  4. Mixed hyperlipidemia  E78.2   5. Hypertriglyceridemia  E78.1   6. Benign hypertension  I10 PCV ECHOCARDIOGRAM COMPLETE    PCV MYOCARDIAL PERFUSION WO LEXISCAN  7. Prediabetes  R73.03 PCV MYOCARDIAL PERFUSION WO LEXISCAN  8. Class 1 obesity  due to excess calories with serious comorbidity and body mass index (BMI) of 31.0 to 31.9 in adult  E66.09    Z68.31   9. Worsening functional endurance  R68.89 PCV MYOCARDIAL PERFUSION WO LEXISCAN    SARS-COV-2 RNA,(COVID-19) QUAL NAAT     RECOMMENDATIONS: Julia Hubbard is a 65 y.o. female whose past medical history and cardiac risk factors include: Hyperlipidemia, HTN, hypothyroidism, Prediabetes, hypertriglyceridemia, depression, obesity due to excess calorie.   Cardiac murmur: Echocardiogram will be ordered to evaluate for structural heart disease and left ventricular systolic function.  Dyspnea on exertion and decreased physical endurance: She has been experiencing shortness of breath with effort  related activities with her day-to-day activities as well as when she tries to hike.  Given her other cardiovascular risk factors including hypertension, hyperlipidemia, hypertriglyceridemia and prediabetes the shared decision was to proceed with an ischemic evaluation.  EKG shows normal sinus rhythm without underlying ischemia or injury pattern.  Echocardiogram as discussed above.  We will schedule for exercise nuclear stress test to evaluate for functional status and reversible ischemia.  Estimated 10-year risk of ASCVD 4.6%.  Syncope: Most likely secondary to dehydration and decreased caloric intake. Educated on the importance of keeping herself well-hydrated. Orthostatic vital signs negative. Ischemic evaluation as discussed above. 14-day extended Holter monitor to evaluate for dysrhythmias. [place] driving restrictions discussed with the patient at today's encounter.  Thank you for allowing [place] to participate in the care of this patient.  Independently reviewed records and labs provided by her PCP as a part of this consultation.  Please do not hesitate if any questions or concerns arise.  Patient was thankful for the time and explanations provided.  FINAL MEDICATION LIST END OF ENCOUNTER: No orders of the defined types were placed in this encounter.   Medications Discontinued During This Encounter  Medication Reason  . LISINOPRIL PO Error  . atorvastatin (LIPITOR) 40 MG tablet Error  . venlafaxine (EFFEXOR) 75 MG tablet Entry Error     Current Outpatient Medications:  .  cholecalciferol (VITAMIN D) 1000 UNITS tablet, Take 1,000 Units by mouth daily., Disp: , Rfl:  .  levothyroxine (SYNTHROID) 50 MCG tablet, Take 1 tablet by mouth daily at 12 noon., Disp: , Rfl:  .  lisinopril-hydrochlorothiazide (PRINZIDE,ZESTORETIC) 20-12.5 MG per tablet, Take 1 tablet by mouth daily., Disp: , Rfl: 0 .  Multiple Vitamin (MULTIVITAMIN) tablet, Take 1 tablet by mouth daily., Disp: , Rfl:  .   rosuvastatin (CRESTOR) 20 MG tablet, Take 1 tablet by mouth daily at 12 noon., Disp: , Rfl:  .  venlafaxine XR (EFFEXOR-XR) 150 MG 24 hr capsule, Take 150 mg by mouth daily with breakfast., Disp: , Rfl:  .  vitamin B-12 (CYANOCOBALAMIN) 50 MCG tablet, Take 50 mcg by mouth daily., Disp: , Rfl:   Orders Placed This Encounter  Procedures  . SARS-COV-2 RNA,(COVID-19) QUAL NAAT  . PCV MYOCARDIAL PERFUSION WO LEXISCAN  . LONG TERM MONITOR (3-14 DAYS)  . EKG 12-Lead  . PCV ECHOCARDIOGRAM COMPLETE    There are no Patient Instructions on file for this visit.   --Continue cardiac medications as reconciled in final medication list. --Return in about 4 weeks (around 07/24/2020) for Follow up, Review test results. Or sooner if needed. --Continue follow-up with your primary care physician regarding the management of your other chronic comorbid conditions.  Patient's questions and concerns were addressed to her satisfaction. She voices understanding of the instructions provided during this encounter.   This note was created  using a voice recognition software as a result there may be grammatical errors inadvertently enclosed that do not reflect the nature of this encounter. Every attempt is made to correct such errors.  Rex Kras, [place], [facility]  Pager: [phone] Office: [phone]

## 2020-06-26 ENCOUNTER — Other Ambulatory Visit: Payer: Self-pay

## 2020-06-26 ENCOUNTER — Encounter: Payer: Self-pay | Admitting: Cardiology

## 2020-06-26 ENCOUNTER — Ambulatory Visit: Payer: 59 | Admitting: Cardiology

## 2020-06-26 VITALS — BP 112/71 | HR 96 | Temp 97.9°F | Resp 16 | Ht 68.0 in | Wt 207.0 lb

## 2020-06-26 DIAGNOSIS — R0600 Dyspnea, unspecified: Secondary | ICD-10-CM

## 2020-06-26 DIAGNOSIS — I10 Essential (primary) hypertension: Secondary | ICD-10-CM

## 2020-06-26 DIAGNOSIS — R6889 Other general symptoms and signs: Secondary | ICD-10-CM

## 2020-06-26 DIAGNOSIS — E6609 Other obesity due to excess calories: Secondary | ICD-10-CM

## 2020-06-26 DIAGNOSIS — Z6831 Body mass index (BMI) 31.0-31.9, adult: Secondary | ICD-10-CM

## 2020-06-26 DIAGNOSIS — E781 Pure hyperglyceridemia: Secondary | ICD-10-CM

## 2020-06-26 DIAGNOSIS — R7303 Prediabetes: Secondary | ICD-10-CM

## 2020-06-26 DIAGNOSIS — R0609 Other forms of dyspnea: Secondary | ICD-10-CM

## 2020-06-26 DIAGNOSIS — E782 Mixed hyperlipidemia: Secondary | ICD-10-CM

## 2020-06-26 DIAGNOSIS — R55 Syncope and collapse: Secondary | ICD-10-CM

## 2020-06-26 DIAGNOSIS — R011 Cardiac murmur, unspecified: Secondary | ICD-10-CM

## 2020-07-05 ENCOUNTER — Other Ambulatory Visit: Payer: Self-pay

## 2020-07-05 DIAGNOSIS — R6889 Other general symptoms and signs: Secondary | ICD-10-CM

## 2020-07-05 DIAGNOSIS — R011 Cardiac murmur, unspecified: Secondary | ICD-10-CM

## 2020-07-05 DIAGNOSIS — I10 Essential (primary) hypertension: Secondary | ICD-10-CM

## 2020-07-05 DIAGNOSIS — R7303 Prediabetes: Secondary | ICD-10-CM

## 2020-07-06 ENCOUNTER — Other Ambulatory Visit: Payer: 59

## 2020-07-12 ENCOUNTER — Ambulatory Visit: Payer: 59

## 2020-07-12 ENCOUNTER — Other Ambulatory Visit: Payer: Self-pay

## 2020-07-16 ENCOUNTER — Other Ambulatory Visit (HOSPITAL_COMMUNITY)
Admission: RE | Admit: 2020-07-16 | Discharge: 2020-07-16 | Disposition: A | Discharge status: Home / Self Care | Payer: 59 | Source: Ambulatory Visit | Attending: Cardiology | Admitting: Cardiology

## 2020-07-16 DIAGNOSIS — Z01812 Encounter for preprocedural laboratory examination: Secondary | ICD-10-CM | POA: Diagnosis not present

## 2020-07-16 DIAGNOSIS — Z20822 Contact with and (suspected) exposure to covid-19: Secondary | ICD-10-CM | POA: Diagnosis not present

## 2020-07-16 NOTE — Progress Notes (Signed)
Called pt to inform her about her echo results. Pt understood.

## 2020-07-17 LAB — SARS CORONAVIRUS 2 (TAT 6-24 HRS): SARS Coronavirus 2: NEGATIVE

## 2020-07-18 ENCOUNTER — Ambulatory Visit: Payer: 59

## 2020-07-18 ENCOUNTER — Inpatient Hospital Stay: Payer: 59

## 2020-07-18 DIAGNOSIS — R55 Syncope and collapse: Secondary | ICD-10-CM

## 2020-07-26 ENCOUNTER — Encounter: Payer: Self-pay | Admitting: Cardiology

## 2020-07-26 ENCOUNTER — Inpatient Hospital Stay: Payer: 59

## 2020-07-26 ENCOUNTER — Other Ambulatory Visit: Payer: Self-pay

## 2020-07-26 ENCOUNTER — Ambulatory Visit: Payer: 59 | Admitting: Cardiology

## 2020-07-26 VITALS — BP 116/72 | HR 74 | Temp 98.5°F | Resp 17 | Ht 68.0 in | Wt 204.0 lb

## 2020-07-26 DIAGNOSIS — E782 Mixed hyperlipidemia: Secondary | ICD-10-CM

## 2020-07-26 DIAGNOSIS — I10 Essential (primary) hypertension: Secondary | ICD-10-CM

## 2020-07-26 DIAGNOSIS — R011 Cardiac murmur, unspecified: Secondary | ICD-10-CM

## 2020-07-26 DIAGNOSIS — R55 Syncope and collapse: Secondary | ICD-10-CM

## 2020-07-26 DIAGNOSIS — R6889 Other general symptoms and signs: Secondary | ICD-10-CM

## 2020-07-26 DIAGNOSIS — R0600 Dyspnea, unspecified: Secondary | ICD-10-CM

## 2020-07-26 DIAGNOSIS — E781 Pure hyperglyceridemia: Secondary | ICD-10-CM

## 2020-07-26 DIAGNOSIS — E6609 Other obesity due to excess calories: Secondary | ICD-10-CM

## 2020-07-26 DIAGNOSIS — R0609 Other forms of dyspnea: Secondary | ICD-10-CM

## 2020-07-26 DIAGNOSIS — Z6831 Body mass index (BMI) 31.0-31.9, adult: Secondary | ICD-10-CM

## 2020-07-26 DIAGNOSIS — R7303 Prediabetes: Secondary | ICD-10-CM

## 2020-07-26 MED ORDER — METOPROLOL TARTRATE 25 MG PO TABS: 12.5000 mg | ORAL_TABLET | Freq: Two times a day (BID) | ORAL | 0 refills | Status: DC

## 2020-07-26 NOTE — Progress Notes (Signed)
Date:  07/26/2020   ID:  [name], DOB 1955-05-02, MRN [id number]  PCP:  Fanny Bien, MD  Cardiologist: Rex Kras, DO, [facility] (established care 06/26/2020)  Date: 07/26/20 Last Office Visit: 06/26/2020  Chief Complaint  Patient presents with   Follow-up   Heart Murmur   Results   Shortness of Breath    HPI  Julia Hubbard is a 65 y.o. female who presents to the office with a chief complaint of " reevaluation of shortness of breath and discuss test results." Patient's past medical history and cardiovascular risk factors include: Hyperlipidemia, HTN, hypothyroidism, Prediabetes, hypertriglyceridemia, depression, obesity due to excess calorie.   She is referred to the office at the request of Fanny Bien, MD for evaluation of murmur.  Had a yearly physical patient was noted to have a cardiac murmur and referred to cardiology for further evaluation and management.  Since last visit she underwent an echocardiogram which notes preserved LVEF, normal diastolic function, and mild TR.  At the last office visit patient was concerned about decreased physical endurance with her day-to-day activities as she would get effort related dyspnea.  Given her multiple cardiovascular risk factors as noted above the shared decision was to proceed with an ischemic evaluation.  Patient underwent a exercise nuclear stress test.  Results reviewed with her in great detail including images findings noted below for further reference.  Clinically since last visit she has not had any episodes of chest pain or syncope.  Her shortness of breath is usually brought on by effort related activities or with overexertion.  She denies orthopnea, paroxysmal nocturnal dyspnea or lower extremity swelling.  Her blood pressures is within excellent control.  FUNCTIONAL STATUS: Swims three days a week.    ALLERGIES: Allergies  Allergen Reactions   Peanut-Containing Drug Products     Pt can eat peanuts but  not certain NUTS! Airway swelling, and lips swell    MEDICATION LIST PRIOR TO VISIT: Current Meds  Medication Sig   cholecalciferol (VITAMIN D) 1000 UNITS tablet Take 1,000 Units by mouth daily.   levothyroxine (SYNTHROID) 50 MCG tablet Take 1 tablet by mouth daily at 12 noon.   lisinopril-hydrochlorothiazide (ZESTORETIC) 20-25 MG tablet Take 1 tablet by mouth daily.   metoprolol tartrate (LOPRESSOR) 25 MG tablet Take 0.5 tablets (12.5 mg total) by mouth 2 (two) times daily.   Multiple Vitamin (MULTIVITAMIN) tablet Take 1 tablet by mouth daily.   rosuvastatin (CRESTOR) 20 MG tablet Take 1 tablet by mouth daily at 12 noon.   venlafaxine XR (EFFEXOR-XR) 150 MG 24 hr capsule Take 150 mg by mouth daily with breakfast.     PAST MEDICAL HISTORY: Past Medical History:  Diagnosis Date   Depression    Hyperlipidemia    Hypertension    Hypertriglyceridemia    Hypothyroidism    Numbness and tingling in hands    during sleep and laying down   Prediabetes     PAST SURGICAL HISTORY: Past Surgical History:  Procedure Laterality Date   TONSILLECTOMY  1977    FAMILY HISTORY: The patient family history includes Breast cancer in her maternal aunt; Colon cancer in her father and paternal grandfather.  SOCIAL HISTORY:  The patient  reports that she has never smoked. She has never used smokeless tobacco. She reports current alcohol use of about 1.0 standard drink of alcohol per week. She reports that she does not use drugs.  REVIEW OF SYSTEMS: Review of Systems  Constitutional: Negative for chills  and fever.  HENT:  Negative for hoarse voice and nosebleeds.   Eyes:  Negative for discharge, double vision and pain.  Cardiovascular:  Positive for dyspnea on exertion. Negative for chest pain, claudication, leg swelling, near-syncope, orthopnea, palpitations, paroxysmal nocturnal dyspnea and syncope.  Respiratory:  Positive for shortness of breath. Negative for hemoptysis.   Musculoskeletal:   Negative for muscle cramps and myalgias.  Gastrointestinal:  Negative for abdominal pain, constipation, diarrhea, hematemesis, hematochezia, melena, nausea and vomiting.  Neurological:  Negative for dizziness and light-headedness.   PHYSICAL EXAM: Vitals with BMI 07/26/2020 06/26/2020 08/05/2019  Height 5' 8" 5' 8" -  Weight 204 lbs 207 lbs -  BMI 93.81 82.99 -  Systolic 371 696 789  Diastolic 72 71 73  Pulse 74 96 92   Orthostatic VS for the past 72 hrs (Last 3 readings):  Patient Position BP Location Cuff Size  07/26/20 1129 Sitting Left Arm Large    CONSTITUTIONAL: Well-developed and well-nourished. No acute distress.  SKIN: Skin is warm and dry. No rash noted. No cyanosis. No pallor. No jaundice HEAD: Normocephalic and atraumatic.  EYES: No scleral icterus MOUTH/THROAT: Moist oral membranes.  NECK: No JVD present. No thyromegaly noted. No carotid bruits  LYMPHATIC: No visible cervical adenopathy.  CHEST Normal respiratory effort. No intercostal retractions  LUNGS: Clear to auscultation bilaterally.  No stridor. No wheezes. No rales.  CARDIOVASCULAR: Regular, positive F8-B0, soft diastolic murmur heard over the left upper sternal border, no gallops or rubs. ABDOMINAL: Obese, soft, nontender, nondistended, positive bowel sounds in all 4 quadrants, no apparent ascites.  EXTREMITIES: No peripheral edema  HEMATOLOGIC: No significant bruising NEUROLOGIC: Oriented to person, place, and time. Nonfocal. Normal muscle tone.  PSYCHIATRIC: Normal mood and affect. Normal behavior. Cooperative  CARDIAC DATABASE: EKG: 06/26/2020: Sinus  Rhythm, 76 bpm, normal axis, without underlying injury pattern.  Echocardiogram: 07/12/2020: Normal LV systolic function with visual EF 55-60%. Left ventricle cavity is normal in size. Mild left ventricular hypertrophy. Normal global wall motion. Normal diastolic filling pattern, normal LAP. Mild tricuspid regurgitation. No prior study for comparison.    Stress Testing: Exercise Sestamibi stress test 07/18/2020: Exercise nuclear stress test was performed using Bruce protocol. Patient reached 7 METS, and 87% of age predicted maximum heart rate. Exercise capacity was low. No chest pain reported. Heart rate and hemodynamic response were normal. Stress EKG revealed no ischemic changes. SPECT images showed medium sized area of moderate intensity, decreased perfusion in inferior/inferoseptal myocardium. In absence of wall motion abnormality. this likely represents breast tissue attenuation, with imaging performed in sitting position. Clinical correlation recommended. Intermediate risk study.  Heart Catheterization: None   LABORATORY DATA: No flowsheet data found.  CMP Latest Ref Rng & Units 02/02/2009  Creatinine 0.4 - 1.2 mg/dL 0.88    Lipid Panel  No results found for: CHOL, TRIG, HDL, CHOLHDL, VLDL, LDLCALC, LDLDIRECT, LABVLDL  No components found for: NTPROBNP No results for input(s): PROBNP in the last 8760 hours. No results for input(s): TSH in the last 8760 hours.  BMP No results for input(s): NA, K, CL, CO2, GLUCOSE, BUN, CREATININE, CALCIUM, GFRNONAA, GFRAA in the last 8760 hours.  HEMOGLOBIN A1C No results found for: HGBA1C, MPG   External Labs:  Date Collected: 05/23/2020 , information obtained by PCP Potassium: 4.5 Creatinine 0.89 mg/dL. eGFR: 72 mL/min per 1.73 m Hemoglobin: 14.6 g/dL and hematocrit: 45.1 % Lipid profile: Total cholesterol 167 , triglycerides 239 , HDL 56 , LDL 72 AST: 36 , ALT: 38 , alkaline  phosphatase: 64  Hemoglobin A1c: 6.0 TSH: 1.660   IMPRESSION:    ICD-10-CM   1. Dyspnea on exertion  R06.00 metoprolol tartrate (LOPRESSOR) 25 MG tablet    2. Worsening functional endurance  R68.89 metoprolol tartrate (LOPRESSOR) 25 MG tablet    3. Murmur  R01.1     4. Syncope and collapse  R55     5. Mixed hyperlipidemia  E78.2     6. Hypertriglyceridemia  E78.1     7. Benign hypertension  I10      8. Prediabetes  R73.03     9. Class 1 obesity due to excess calories with serious comorbidity and body mass index (BMI) of 31.0 to 31.9 in adult  E66.09    Z68.31        RECOMMENDATIONS: Julia Hubbard is a 65 y.o. female whose past medical history and cardiac risk factors include: Hyperlipidemia, HTN, hypothyroidism, Prediabetes, hypertriglyceridemia, depression, obesity due to excess calorie.   Dyspnea on exertion and decreased physical endurance: Echocardiogram notes preserved LVEF, normal diastolic function, and no significant valvular heart disease.   Stress test was overall intermediate risk study.  Stress test independently reviewed.  She is noted to have a fixed perfusion defect most likely secondary to soft tissue attenuation due to pendulous breast as the images were taken in an upright position.   Shared decision was to uptitrate medical therapy and to reevaluate her symptoms.   We did discuss considering coronary CTA as an alternative due to her stress test results, effort related dyspnea, and multiple cardiovascular risk factors.  However, shared decision was to proceed with medical therapy and to reevaluate her symptoms in 4 weeks.  If the symptoms continue patient is agreeable with proceeding with coronary CTA for further evaluation.   Start Lopressor 12.5 mg p.o. twice daily. Educated on the importance of improving her modifiable cardiovascular risk factors as noted above.  Syncope: No reoccurrence of symptoms since last office visit.   Echocardiogram results reviewed.   Nuclear stress test results reviewed and discussed in great detail including reviewing the images. Patient just had her extended Holter monitor placed today, results pending   Cardiac murmur: Noted to have mild tricuspid regurgitation on most recent echocardiogram.  Overall asymptomatic.  Continue to monitor.  No additional testing warranted at this time.  FINAL MEDICATION LIST END OF ENCOUNTER: Meds  ordered this encounter  Medications   metoprolol tartrate (LOPRESSOR) 25 MG tablet    Sig: Take 0.5 tablets (12.5 mg total) by mouth 2 (two) times daily.    Dispense:  90 tablet    Refill:  0    Medications Discontinued During This Encounter  Medication Reason   vitamin B-12 (CYANOCOBALAMIN) 50 MCG tablet Error     Current Outpatient Medications:    cholecalciferol (VITAMIN D) 1000 UNITS tablet, Take 1,000 Units by mouth daily., Disp: , Rfl:    levothyroxine (SYNTHROID) 50 MCG tablet, Take 1 tablet by mouth daily at 12 noon., Disp: , Rfl:    lisinopril-hydrochlorothiazide (ZESTORETIC) 20-25 MG tablet, Take 1 tablet by mouth daily., Disp: , Rfl: 0   metoprolol tartrate (LOPRESSOR) 25 MG tablet, Take 0.5 tablets (12.5 mg total) by mouth 2 (two) times daily., Disp: 90 tablet, Rfl: 0   Multiple Vitamin (MULTIVITAMIN) tablet, Take 1 tablet by mouth daily., Disp: , Rfl:    rosuvastatin (CRESTOR) 20 MG tablet, Take 1 tablet by mouth daily at 12 noon., Disp: , Rfl:    venlafaxine XR (EFFEXOR-XR) 150 MG 24  hr capsule, Take 150 mg by mouth daily with breakfast., Disp: , Rfl:   No orders of the defined types were placed in this encounter.  There are no Patient Instructions on file for this visit.   --Continue cardiac medications as reconciled in final medication list. --Return in about 4 weeks (around 08/23/2020) for Follow up, Dyspnea, Review test results. Or sooner if needed. --Continue follow-up with your primary care physician regarding the management of your other chronic comorbid conditions.  Patient's questions and concerns were addressed to her satisfaction. She voices understanding of the instructions provided during this encounter.   This note was created using a voice recognition software as a result there may be grammatical errors inadvertently enclosed that do not reflect the nature of this encounter. Every attempt is made to correct such errors.  Rex Kras, [place], [facility]  Pager:  [phone] Office: [phone]

## 2020-07-31 ENCOUNTER — Other Ambulatory Visit: Payer: Self-pay

## 2020-07-31 ENCOUNTER — Ambulatory Visit: Payer: 59

## 2020-08-02 ENCOUNTER — Ambulatory Visit: Payer: 59 | Admitting: Cardiology

## 2020-08-06 NOTE — Progress Notes (Signed)
Sending to all for review/completion.  AD

## 2020-08-15 ENCOUNTER — Ambulatory Visit: Payer: 59 | Admitting: Cardiology

## 2020-08-20 ENCOUNTER — Telehealth: Payer: Self-pay | Admitting: Cardiology

## 2020-08-24 ENCOUNTER — Other Ambulatory Visit: Payer: Self-pay

## 2020-08-24 ENCOUNTER — Ambulatory Visit: Payer: 59 | Admitting: Cardiology

## 2020-08-24 ENCOUNTER — Encounter: Payer: Self-pay | Admitting: Cardiology

## 2020-08-24 VITALS — BP 137/69 | HR 60 | Temp 98.1°F | Ht 68.0 in | Wt 204.8 lb

## 2020-08-24 DIAGNOSIS — R0600 Dyspnea, unspecified: Secondary | ICD-10-CM

## 2020-08-24 DIAGNOSIS — I10 Essential (primary) hypertension: Secondary | ICD-10-CM

## 2020-08-24 DIAGNOSIS — I491 Atrial premature depolarization: Secondary | ICD-10-CM

## 2020-08-24 DIAGNOSIS — R011 Cardiac murmur, unspecified: Secondary | ICD-10-CM

## 2020-08-24 DIAGNOSIS — Z6831 Body mass index (BMI) 31.0-31.9, adult: Secondary | ICD-10-CM

## 2020-08-24 DIAGNOSIS — R0609 Other forms of dyspnea: Secondary | ICD-10-CM

## 2020-08-24 DIAGNOSIS — R7303 Prediabetes: Secondary | ICD-10-CM

## 2020-08-24 DIAGNOSIS — R55 Syncope and collapse: Secondary | ICD-10-CM

## 2020-08-24 DIAGNOSIS — E6609 Other obesity due to excess calories: Secondary | ICD-10-CM

## 2020-08-24 DIAGNOSIS — E782 Mixed hyperlipidemia: Secondary | ICD-10-CM

## 2020-08-24 DIAGNOSIS — R6889 Other general symptoms and signs: Secondary | ICD-10-CM

## 2020-08-24 DIAGNOSIS — E781 Pure hyperglyceridemia: Secondary | ICD-10-CM

## 2020-08-24 MED ORDER — METOPROLOL SUCCINATE ER 25 MG PO TB24: 25.0000 mg | ORAL_TABLET | Freq: Every morning | ORAL | 0 refills | Status: AC

## 2020-08-24 NOTE — Progress Notes (Signed)
Date:  08/24/2020   ID:  Julia Hubbard, DOB 20-Feb-1955, MRN [id number]  PCP:  Lewis Moccasin, MD  Cardiologist: Tessa Lerner, DO, [facility] (established care 06/26/2020)  Date: 08/24/20 Last Office Visit: 07/26/2020  Chief Complaint  Patient presents with   Shortness of Breath   Results    HPI  Julia Hubbard is a 65 y.o. female who presents to the office with a chief complaint of " reevaluation of shortness of breath and discuss test results." Patient's past medical history and cardiovascular risk factors include: Hyperlipidemia, HTN, hypothyroidism, Prediabetes, hypertriglyceridemia, depression, obesity due to excess calorie.   She is referred to the office at the request of Lewis Moccasin, MD for evaluation of murmur.  Patient was referred to the office for evaluation of a cardiac murmur after her yearly physical examination.  Underwent an echocardiogram was noted to have no significant valvular heart disease.  However, she is been experiencing shortness of breath with effort related activities and also decrease in overall functional status.  And given her other cardiac comorbid conditions and the shared decision was to proceed with stress testing.  She underwent an exercise nuclear stress test in the recent past and was noted to have intermediate risk study.  The images were reviewed in great detail with the patient at the last office visit which noted a fixed perfusion defect most likely secondary to soft tissue attenuation due to pendulous breast as the images were taken in an upright position.  The shared decision at the last office visit was to uptitrate GDMT and we can evaluate her in 4 weeks and if the symptoms continue to consider coronary CTA.  Patient is here for follow-up.  At the last office visit patient was started on Lopressor 12.5 mg p.o. twice daily which is helped her symptoms shortness of breath and effort related dyspnea.  Patient states that shortness of  breath is completely resolved and dyspnea on exertion has improved significantly.  She also had a extended Holter monitor which was reviewed with her in great detail with findings noted below for further reference.  Overall normal sinus rhythm with occasional PACs.  FUNCTIONAL STATUS: Swims three days a week.    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 Airway swelling, and lips swell Pt can eat peanuts but not certain NUTS! Airway swelling, and lips swell    MEDICATION LIST PRIOR TO VISIT: Current Meds  Medication Sig   cholecalciferol (VITAMIN D) 1000 UNITS tablet Take 1,000 Units by mouth daily.   levothyroxine (SYNTHROID) 50 MCG tablet Take 1 tablet by mouth daily at 12 noon.   lisinopril-hydrochlorothiazide (ZESTORETIC) 20-25 MG tablet Take 1 tablet by mouth daily.   metoprolol succinate (TOPROL XL) 25 MG 24 hr tablet Take 1 tablet (25 mg total) by mouth every morning.   Multiple Vitamin (MULTIVITAMIN) tablet Take 1 tablet by mouth daily.   rosuvastatin (CRESTOR) 20 MG tablet Take 1 tablet by mouth daily at 12 noon.   venlafaxine XR (EFFEXOR-XR) 150 MG 24 hr capsule Take 150 mg by mouth daily with breakfast.   [DISCONTINUED] metoprolol tartrate (LOPRESSOR) 25 MG tablet Take 0.5 tablets (12.5 mg total) by mouth 2 (two) times daily.     PAST MEDICAL HISTORY: Past Medical History:  Diagnosis Date   Depression    Hyperlipidemia    Hypertension  Hypertriglyceridemia    Hypothyroidism    Numbness and tingling in hands    during sleep and laying down   Prediabetes     PAST SURGICAL HISTORY: Past Surgical History:  Procedure Laterality Date   TONSILLECTOMY  1977    FAMILY HISTORY: The patient family history includes Breast cancer in her maternal aunt; Colon cancer in her father and paternal grandfather.  SOCIAL HISTORY:   The patient  reports that she has never smoked. She has never used smokeless tobacco. She reports current alcohol use of about 1.0 standard drink of alcohol per week. She reports that she does not use drugs.  REVIEW OF SYSTEMS: Review of Systems  Constitutional: Negative for chills and fever.  HENT:  Negative for hoarse voice and nosebleeds.   Eyes:  Negative for discharge, double vision and pain.  Cardiovascular:  Positive for dyspnea on exertion (better). Negative for chest pain, claudication, leg swelling, near-syncope, orthopnea, palpitations, paroxysmal nocturnal dyspnea and syncope.  Respiratory:  Negative for hemoptysis and shortness of breath.   Musculoskeletal:  Negative for muscle cramps and myalgias.  Gastrointestinal:  Negative for abdominal pain, constipation, diarrhea, hematemesis, hematochezia, melena, nausea and vomiting.  Neurological:  Negative for dizziness and light-headedness.   PHYSICAL EXAM: Vitals with BMI 08/24/2020 07/26/2020 06/26/2020  Height $Remov'5\' 8"'yCRCry$  $Remove'5\' 8"'FcqhMlV$  $RemoveB'5\' 8"'zCztKbyB$   Weight 204 lbs 13 oz 204 lbs 207 lbs  BMI 31.15 67.61 95.09  Systolic 326 712 458  Diastolic 69 72 71  Pulse 60 74 96   Orthostatic VS for the past 72 hrs (Last 3 readings):  Patient Position BP Location Cuff Size  08/24/20 1126 Sitting Left Arm Normal    CONSTITUTIONAL: Well-developed and well-nourished. No acute distress.  SKIN: Skin is warm and dry. No rash noted. No cyanosis. No pallor. No jaundice HEAD: Normocephalic and atraumatic.  EYES: No scleral icterus MOUTH/THROAT: Moist oral membranes.  NECK: No JVD present. No thyromegaly noted. No carotid bruits  LYMPHATIC: No visible cervical adenopathy.  CHEST Normal respiratory effort. No intercostal retractions  LUNGS: Clear to auscultation bilaterally.  No stridor. No wheezes. No rales.  CARDIOVASCULAR: Regular, positive K9-X8, soft diastolic murmur heard over the left upper sternal border, no gallops or rubs. ABDOMINAL: Obese, soft, nontender,  nondistended, positive bowel sounds in all 4 quadrants, no apparent ascites.  EXTREMITIES: No peripheral edema  HEMATOLOGIC: No significant bruising NEUROLOGIC: Oriented to person, place, and time. Nonfocal. Normal muscle tone.  PSYCHIATRIC: Normal mood and affect. Normal behavior. Cooperative  CARDIAC DATABASE: EKG: 06/26/2020: Sinus  Rhythm, 76 bpm, normal axis, without underlying injury pattern.  Echocardiogram: 07/12/2020: Normal LV systolic function with visual EF 55-60%. Left ventricle cavity is normal in size. Mild left ventricular hypertrophy. Normal global wall motion. Normal diastolic filling pattern, normal LAP. Mild tricuspid regurgitation. No prior study for comparison.   Stress Testing: Exercise Sestamibi stress test 07/18/2020: Exercise nuclear stress test was performed using Bruce protocol. Patient reached 7 METS, and 87% of age predicted maximum heart rate. Exercise capacity was low. No chest pain reported. Heart rate and hemodynamic response were normal. Stress EKG revealed no ischemic changes. SPECT images showed medium sized area of moderate intensity, decreased perfusion in inferior/inferoseptal myocardium. In absence of wall motion abnormality. this likely represents breast tissue attenuation, with imaging performed in sitting position. Clinical correlation recommended. Intermediate risk study.  Heart Catheterization: None   Extended Holter monitor: Originally scheduled for a 14-day monitor however due to technical difficulties she had 2 separate monitors and wear time was  approximately 4 days and 15 hours. Dominant rhythm normal sinus. Heart rate 40-184 bpm.  Avg HR 69 bpm. No atrial fibrillation, ventricular tachycardia, high grade AV block, pauses (3 seconds or longer). Total ventricular ectopic burden <1%. Total supraventricular ectopic burden <1%. Patient triggered events: Normal sinus rhythm with occasional PACs.  LABORATORY DATA: No flowsheet data  found.  CMP Latest Ref Rng & Units 02/02/2009  Creatinine 0.4 - 1.2 mg/dL 0.88    Lipid Panel  No results found for: CHOL, TRIG, HDL, CHOLHDL, VLDL, LDLCALC, LDLDIRECT, LABVLDL  No components found for: NTPROBNP No results for input(s): PROBNP in the last 8760 hours. No results for input(s): TSH in the last 8760 hours.  BMP No results for input(s): NA, K, CL, CO2, GLUCOSE, BUN, CREATININE, CALCIUM, GFRNONAA, GFRAA in the last 8760 hours.  HEMOGLOBIN A1C No results found for: HGBA1C, MPG   External Labs:  Date Collected: 05/23/2020 , information obtained by PCP Potassium: 4.5 Creatinine 0.89 mg/dL. eGFR: 72 mL/min per 1.73 m Hemoglobin: 14.6 g/dL and hematocrit: 45.1 % Lipid profile: Total cholesterol 167 , triglycerides 239 , HDL 56 , LDL 72 AST: 36 , ALT: 38 , alkaline phosphatase: 64  Hemoglobin A1c: 6.0 TSH: 1.660   IMPRESSION:    ICD-10-CM   1. Dyspnea on exertion  R06.00 metoprolol succinate (TOPROL XL) 25 MG 24 hr tablet    2. PAC (premature atrial contraction)  I49.1 metoprolol succinate (TOPROL XL) 25 MG 24 hr tablet    3. Worsening functional endurance  R68.89     4. Mixed hyperlipidemia  E78.2     5. Hypertriglyceridemia  E78.1     6. Benign hypertension  I10     7. Prediabetes  R73.03     8. Class 1 obesity due to excess calories with serious comorbidity and body mass index (BMI) of 31.0 to 31.9 in adult  E66.09    Z68.31        RECOMMENDATIONS: ETHERINE MACKOWIAK is a 65 y.o. female whose past medical history and cardiac risk factors include: Hyperlipidemia, HTN, hypothyroidism, Prediabetes, hypertriglyceridemia, depression, obesity due to excess calorie.   Dyspnea on exertion and decreased physical endurance: Significantly improved compared to the last office visit. Tolerated Lopressor well, we will transition her to Toprol-XL 25 mg p.o. daily. Results of the extended Holter monitor reviewed with her in great detail. The shared decision was to  continue with current medical therapy as she does not have any anginal discomfort, dyspnea on exertion has improved, and her overall functional status is also improving. Patient would like to hold off any additional testing such as coronary CTA at this time, which is reasonable given her improvement in symptoms  Premature atrial contractions: Continue beta-blocker therapy.  No significant PAC burden on recent extended Holter monitor.  Monitor for now  Will see her back in 3 months for reevaluation of symptoms.  However if she has new onset of chest pain, worsening of shortness of breath or further decrease in physical endurance she is asked to come back sooner or go to the closest ER via EMS for further evaluation and management.  Patient verbalizes understanding.  And is thankful for the work-up and follow-up provided.  FINAL MEDICATION LIST END OF ENCOUNTER: Meds ordered this encounter  Medications   metoprolol succinate (TOPROL XL) 25 MG 24 hr tablet    Sig: Take 1 tablet (25 mg total) by mouth every morning.    Dispense:  90 tablet    Refill:  0  Medications Discontinued During This Encounter  Medication Reason   metoprolol tartrate (LOPRESSOR) 25 MG tablet Dose change     Current Outpatient Medications:    cholecalciferol (VITAMIN D) 1000 UNITS tablet, Take 1,000 Units by mouth daily., Disp: , Rfl:    levothyroxine (SYNTHROID) 50 MCG tablet, Take 1 tablet by mouth daily at 12 noon., Disp: , Rfl:    lisinopril-hydrochlorothiazide (ZESTORETIC) 20-25 MG tablet, Take 1 tablet by mouth daily., Disp: , Rfl: 0   metoprolol succinate (TOPROL XL) 25 MG 24 hr tablet, Take 1 tablet (25 mg total) by mouth every morning., Disp: 90 tablet, Rfl: 0   Multiple Vitamin (MULTIVITAMIN) tablet, Take 1 tablet by mouth daily., Disp: , Rfl:    rosuvastatin (CRESTOR) 20 MG tablet, Take 1 tablet by mouth daily at 12 noon., Disp: , Rfl:    venlafaxine XR (EFFEXOR-XR) 150 MG 24 hr capsule, Take 150 mg by mouth  daily with breakfast., Disp: , Rfl:   No orders of the defined types were placed in this encounter.  There are no Patient Instructions on file for this visit.   --Continue cardiac medications as reconciled in final medication list. --Return in about 3 months (around 11/24/2020) for Follow up, Dyspnea. Or sooner if needed. --Continue follow-up with your primary care physician regarding the management of your other chronic comorbid conditions.  Patient's questions and concerns were addressed to her satisfaction. She voices understanding of the instructions provided during this encounter.   This note was created using a voice recognition software as a result there may be grammatical errors inadvertently enclosed that do not reflect the nature of this encounter. Every attempt is made to correct such errors.  Rex Kras, [place], [facility]  Pager: [phone] Office: [phone]

## 2020-08-30 NOTE — Telephone Encounter (Signed)
Complete

## 2020-10-02 ENCOUNTER — Encounter: Payer: Self-pay | Admitting: Gastroenterology

## 2020-11-02 ENCOUNTER — Other Ambulatory Visit: Payer: Self-pay

## 2020-11-02 ENCOUNTER — Encounter: Payer: Self-pay | Admitting: Gastroenterology

## 2020-11-02 ENCOUNTER — Ambulatory Visit (AMBULATORY_SURGERY_CENTER): Payer: 59 | Admitting: *Deleted

## 2020-11-02 VITALS — Ht 68.0 in | Wt 200.0 lb

## 2020-11-02 DIAGNOSIS — Z86010 Personal history of colonic polyps: Secondary | ICD-10-CM

## 2020-11-02 MED ORDER — PEG-KCL-NACL-NASULF-NA ASC-C 100 G PO SOLR: 1.0000 | Freq: Once | ORAL | 0 refills | Status: AC

## 2020-11-02 NOTE — Progress Notes (Signed)
Virtual pre visit completed over telephone. Instructions sent to e-mail [email or web address] Coupon and hard copy mailed to patient.     No egg or soy allergy known to patient  No issues known to pt with past sedation with any surgeries or procedures Patient denies ever being told they had issues or difficulty with intubation  No FH of Malignant Hyperthermia Pt is not on diet pills Pt is not on  home 02  Pt is not on blood thinners  Pt denies issues with constipation  No A fib or A flutter  Pt is fully vaccinated  for Covid   Discussed with pt there will be an out-of-pocket cost for prep and that varies from $0 to 70 +  dollars - pt verbalized understanding   Due to the COVID-19 pandemic we are asking patients to follow certain guidelines in [facility] and the [facility]   Pt aware of COVID protocols and [facility] guidelines

## 2020-11-16 ENCOUNTER — Other Ambulatory Visit: Payer: Self-pay

## 2020-11-16 ENCOUNTER — Ambulatory Visit (AMBULATORY_SURGERY_CENTER): Payer: 59 | Admitting: Gastroenterology

## 2020-11-16 ENCOUNTER — Encounter: Payer: Self-pay | Admitting: Gastroenterology

## 2020-11-16 VITALS — BP 103/67 | HR 61 | Temp 98.4°F | Resp 18 | Ht 68.0 in | Wt 204.0 lb

## 2020-11-16 DIAGNOSIS — D125 Benign neoplasm of sigmoid colon: Secondary | ICD-10-CM

## 2020-11-16 DIAGNOSIS — K635 Polyp of colon: Secondary | ICD-10-CM | POA: Diagnosis not present

## 2020-11-16 DIAGNOSIS — Z86010 Personal history of colonic polyps: Secondary | ICD-10-CM | POA: Diagnosis present

## 2020-11-16 MED ORDER — SODIUM CHLORIDE 0.9 % IV SOLN: 500.0000 mL | Freq: Once | INTRAVENOUS | Status: DC

## 2020-11-16 NOTE — Progress Notes (Signed)
VS completed by MO.  Pt's states no medical or surgical changes since previsit or office visit.

## 2020-11-16 NOTE — Progress Notes (Signed)
Sedate, gd SR, tolerated procedure well, VSS, report to RN 

## 2020-11-16 NOTE — Progress Notes (Signed)
Called to room to assist during endoscopic procedure.  Patient ID and intended procedure confirmed with present staff. Received instructions for my participation in the procedure from the performing physician.  

## 2020-11-16 NOTE — Patient Instructions (Signed)
Thank you for allowing [place] to care for you today! Await result of polyp removed, approximately 7-10 days. Will make recommendation at that time for next colonoscopy, likely 5 years. Resume previous diet and medications today, return to normal daily activities tomorrow, 11/17/20.    YOU HAD AN ENDOSCOPIC PROCEDURE TODAY AT [facility]:   Refer to the procedure report that was given to you for any specific questions about what was found during the examination.  If the procedure report does not answer your questions, please call your gastroenterologist to clarify.  If you requested that your care partner not be given the details of your procedure findings, then the procedure report has been included in a sealed envelope for you to review at your convenience later.  YOU SHOULD EXPECT: Some feelings of bloating in the abdomen. Passage of more gas than usual.  Walking can help get rid of the air that was put into your GI tract during the procedure and reduce the bloating. If you had a lower endoscopy (such as a colonoscopy or flexible sigmoidoscopy) you may notice spotting of blood in your stool or on the toilet paper. If you underwent a bowel prep for your procedure, you may not have a normal bowel movement for a few days.  Please Note:  You might notice some irritation and congestion in your nose or some drainage.  This is from the oxygen used during your procedure.  There is no need for concern and it should clear up in a day or so.  SYMPTOMS TO REPORT IMMEDIATELY:  Following lower endoscopy (colonoscopy or flexible sigmoidoscopy):  Excessive amounts of blood in the stool  Significant tenderness or worsening of abdominal pains  Swelling of the abdomen that is new, acute  Fever of 100F or higher    For urgent or emergent issues, a gastroenterologist can be reached at any hour by calling [phone]. Do not use MyChart messaging for urgent concerns.    DIET:  We do recommend  a small meal at first, but then you may proceed to your regular diet.  Drink plenty of fluids but you should avoid alcoholic beverages for 24 hours.  ACTIVITY:  You should plan to take it easy for the rest of today and you should NOT DRIVE or use heavy machinery until tomorrow (because of the sedation medicines used during the test).    FOLLOW UP: Our staff will call the number listed on your records 48-72 hours following your procedure to check on you and address any questions or concerns that you may have regarding the information given to you following your procedure. If we do not reach you, we will leave a message.  We will attempt to reach you two times.  During this call, we will ask if you have developed any symptoms of COVID 19. If you develop any symptoms (ie: fever, flu-like symptoms, shortness of breath, cough etc.) before then, please call [phone].  If you test positive for Covid 19 in the 2 weeks post procedure, please call and report this information to [place].    If any biopsies were taken you will be contacted by phone or by letter within the next 1-3 weeks.  Please call us at [phone] if you have not heard about the biopsies in 3 weeks.    SIGNATURES/CONFIDENTIALITY: You and/or your care partner have signed paperwork which will be entered into your electronic medical record.  These signatures attest to the fact that that the information  above on your After Visit Summary has been reviewed and is understood.  Full responsibility of the confidentiality of this discharge information lies with you and/or your care-partner.  

## 2020-11-16 NOTE — Progress Notes (Signed)
[facility] History and Physical   Primary Care Physician:  Fanny Bien, MD   Reason for Procedure:  Family history of colon cancer and personal h/o colon polyps  Plan:   colonoscopy with possible interventions as needed     HPI: Julia Hubbard is a very pleasant 65 y.o. female here for  colonoscopy. Denies any nausea, vomiting, abdominal pain, melena or bright red blood per rectum  The risks and benefits as well as alternatives of endoscopic procedure(s) have been discussed and reviewed. All questions answered. The patient agrees to proceed.    Past Medical History:  Diagnosis Date   Depression    Heart murmur    Hyperlipidemia    Hypertension    Hypertriglyceridemia    Hypothyroidism    Numbness and tingling in hands    during sleep and laying down   Prediabetes     Past Surgical History:  Procedure Laterality Date   COLONOSCOPY     TONSILLECTOMY  01/21/1975    Prior to Admission medications   Medication Sig Start Date End Date Taking? Authorizing Provider  cholecalciferol (VITAMIN D) 1000 UNITS tablet Take 1,000 Units by mouth daily.   Yes [provider]  levothyroxine (SYNTHROID) 50 MCG tablet Take 75 mcg by mouth daily at 12 noon. 01/31/16  Yes [provider]  lisinopril-hydrochlorothiazide (ZESTORETIC) 20-25 MG tablet Take 1 tablet by mouth daily. 03/21/14  Yes [provider]  metoprolol succinate (TOPROL XL) 25 MG 24 hr tablet Take 1 tablet (25 mg total) by mouth every morning. 08/24/20 11/22/20 Yes Tolia, Sunit, DO  Multiple Vitamin (MULTIVITAMIN) tablet Take 1 tablet by mouth daily.   Yes [provider]  rosuvastatin (CRESTOR) 20 MG tablet Take 1 tablet by mouth daily at 12 noon.   Yes [provider]  venlafaxine XR (EFFEXOR-XR) 150 MG 24 hr capsule Take 150 mg by mouth daily with breakfast.   Yes [provider]    Current Outpatient Medications  Medication Sig Dispense Refill    cholecalciferol (VITAMIN D) 1000 UNITS tablet Take 1,000 Units by mouth daily.     levothyroxine (SYNTHROID) 50 MCG tablet Take 75 mcg by mouth daily at 12 noon.     lisinopril-hydrochlorothiazide (ZESTORETIC) 20-25 MG tablet Take 1 tablet by mouth daily.  0   metoprolol succinate (TOPROL XL) 25 MG 24 hr tablet Take 1 tablet (25 mg total) by mouth every morning. 90 tablet 0   Multiple Vitamin (MULTIVITAMIN) tablet Take 1 tablet by mouth daily.     rosuvastatin (CRESTOR) 20 MG tablet Take 1 tablet by mouth daily at 12 noon.     venlafaxine XR (EFFEXOR-XR) 150 MG 24 hr capsule Take 150 mg by mouth daily with breakfast.     Current Facility-Administered Medications  Medication Dose Route Frequency Provider Last Rate Last Admin   0.9 %  sodium chloride infusion  500 mL Intravenous Once Mauri Pole, MD        Allergies as of 11/16/2020 - Review Complete 11/16/2020  Allergen Reaction Noted   Justicia adhatoda (malabar nut tree) [justicia adhatoda] Anaphylaxis 04/04/2016   Peanut-containing drug products  03/21/2014    Family History  Problem Relation Age of Onset   Colon cancer Father    Breast cancer Maternal Aunt    Colon cancer Paternal Grandfather    Stomach cancer Neg Hx    Rectal cancer Neg Hx    Esophageal cancer Neg Hx     Social History   Socioeconomic  History   Marital status: Divorced    Spouse name: Not on file   Number of children: Not on file   Years of education: Not on file   Highest education level: Not on file  Occupational History   Not on file  Tobacco Use   Smoking status: Never   Smokeless tobacco: Never  Vaping Use   Vaping Use: Never used  Substance and Sexual Activity   Alcohol use: Yes    Alcohol/week: 1.0 standard drink    Types: 1 Glasses of wine per week    Comment: social    Drug use: No   Sexual activity: Not on file  Other Topics Concern   Not on file  Social History Narrative   Not on file   Social Determinants of Health    Financial Resource Strain: Not on file  Food Insecurity: Not on file  Transportation Needs: Not on file  Physical Activity: Not on file  Stress: Not on file  Social Connections: Not on file  Intimate Partner Violence: Not on file    Review of Systems:  All other review of systems negative except as mentioned in the HPI.  Physical Exam: Vital signs in last 24 hours: BP (!) 145/88   Pulse 69   Temp 98.4 F (36.9 C) (Temporal)   Ht 5\' 8"  (1.727 m)   Wt 204 lb (92.5 kg)   SpO2 95%   BMI 31.02 kg/m     General:   Alert, NAD Lungs:  Clear .   Heart:  Regular rate and rhythm Abdomen:  Soft, nontender and nondistended. Neuro/Psych:  Alert and cooperative. Normal mood and affect. A and O x 3  Reviewed labs, radiology imaging, old records and pertinent past GI work up  Patient is appropriate for planned procedure(s) and anesthesia in an ambulatory setting   K. Denzil Magnuson , MD [phone]

## 2020-11-16 NOTE — Op Note (Signed)
[facility] Patient Name: Julia Hubbard Procedure Date: 11/16/2020 7:11 AM MRN: [id number] Endoscopist: Mauri Pole , MD Age: 65 Referring MD:  Date of Birth: May 19, 1955 Gender: Female Account #: [id number] Procedure:                Colonoscopy Indications:              Colon cancer screening in patient at increased                            risk: Colorectal cancer in father, High risk colon                            cancer surveillance: Personal history of colonic                            polyps, High risk colon cancer surveillance:                            Personal history of adenoma less than 10 mm in size Medicines:                Monitored Anesthesia Care Procedure:                Pre-Anesthesia Assessment:                           - Prior to the procedure, a History and Physical                            was performed, and patient medications and                            allergies were reviewed. The patient's tolerance of                            previous anesthesia was also reviewed. The risks                            and benefits of the procedure and the sedation                            options and risks were discussed with the patient.                            All questions were answered, and informed consent                            was obtained. Prior Anticoagulants: The patient has                            taken no previous anticoagulant or antiplatelet                            agents. ASA Grade Assessment: II - A patient with  mild systemic disease. After reviewing the risks                            and benefits, the patient was deemed in                            satisfactory condition to undergo the procedure.                           After obtaining informed consent, the colonoscope                            was passed under direct vision. Throughout the                            procedure,  the patient's blood pressure, pulse, and                            oxygen saturations were monitored continuously. The                            PCF-HQ190L Colonoscope was introduced through the                            anus and advanced to the the cecum, identified by                            appendiceal orifice and ileocecal valve. The                            colonoscopy was performed without difficulty. The                            patient tolerated the procedure well. The quality                            of the bowel preparation was excellent. The                            ileocecal valve, appendiceal orifice, and rectum                            were photographed. Scope In: 8:14:49 AM Scope Out: 8:29:29 AM Scope Withdrawal Time: 0 hours 9 minutes 34 seconds  Total Procedure Duration: 0 hours 14 minutes 40 seconds  Findings:                 The perianal and digital rectal examinations were                            normal.                           A 3 mm polyp was found in the sigmoid colon. The  polyp was sessile. The polyp was removed with a                            cold snare. Resection and retrieval were complete.                           A few small-mouthed diverticula were found in the                            sigmoid colon.                           Non-bleeding external and internal hemorrhoids were                            found during retroflexion. The hemorrhoids were                            small.                           The exam was otherwise without abnormality. Complications:            No immediate complications. Estimated Blood Loss:     Estimated blood loss was minimal. Impression:               - One 3 mm polyp in the sigmoid colon, removed with                            a cold snare. Resected and retrieved.                           - Diverticulosis in the sigmoid colon.                           -  Non-bleeding external and internal hemorrhoids.                           - The examination was otherwise normal. Recommendation:           - Patient has a contact number available for                            emergencies. The signs and symptoms of potential                            delayed complications were discussed with the                            patient. Return to normal activities tomorrow.                            Written discharge instructions were provided to the                            patient.                           -  Resume previous diet.                           - Continue present medications.                           - Await pathology results.                           - Repeat colonoscopy in 5 years for surveillance                            based on pathology results. Mauri Pole, MD 11/16/2020 8:37:38 AM This report has been signed electronically.

## 2020-11-20 ENCOUNTER — Telehealth: Payer: Self-pay

## 2020-11-20 NOTE — Telephone Encounter (Signed)
  Follow up Call-  Call back number 11/16/2020  Post procedure Call Back phone  # [phone]  Permission to leave phone message Yes  Some recent data might be hidden     Patient questions:  Do you have a fever, pain , or abdominal swelling? No. Pain Score  0 *  Have you tolerated food without any problems? Yes.    Have you been able to return to your normal activities? Yes.    Do you have any questions about your discharge instructions: Diet   No. Medications  No. Follow up visit  No.  Do you have questions or concerns about your Care? No.  Actions: * If pain score is 4 or above: No action needed, pain <4.   Have you developed a fever since your procedure? no  2.   Have you had an respiratory symptoms (SOB or cough) since your procedure? no  3.   Have you tested positive for COVID 19 since your procedure no  4.   Have you had any family members/close contacts diagnosed with the COVID 19 since your procedure?  no   If yes to any of these questions please route to Joylene John, RN and Joella Prince, RN

## 2020-11-27 ENCOUNTER — Encounter: Payer: Self-pay | Admitting: Gastroenterology

## 2020-11-27 ENCOUNTER — Ambulatory Visit: Payer: 59 | Admitting: Cardiology

## 2020-12-24 ENCOUNTER — Other Ambulatory Visit: Payer: Self-pay

## 2020-12-24 ENCOUNTER — Encounter (HOSPITAL_BASED_OUTPATIENT_CLINIC_OR_DEPARTMENT_OTHER): Payer: Self-pay | Admitting: Emergency Medicine

## 2020-12-24 ENCOUNTER — Emergency Department (HOSPITAL_BASED_OUTPATIENT_CLINIC_OR_DEPARTMENT_OTHER)
Admission: EM | Admit: 2020-12-24 | Discharge: 2020-12-24 | Disposition: A | Discharge status: Home / Self Care | Payer: 59 | Attending: Emergency Medicine | Admitting: Emergency Medicine

## 2020-12-24 ENCOUNTER — Emergency Department (HOSPITAL_BASED_OUTPATIENT_CLINIC_OR_DEPARTMENT_OTHER): Payer: 59 | Admitting: Radiology

## 2020-12-24 DIAGNOSIS — Z79899 Other long term (current) drug therapy: Secondary | ICD-10-CM | POA: Diagnosis not present

## 2020-12-24 DIAGNOSIS — Z20822 Contact with and (suspected) exposure to covid-19: Secondary | ICD-10-CM | POA: Insufficient documentation

## 2020-12-24 DIAGNOSIS — Z91010 Allergy to peanuts: Secondary | ICD-10-CM | POA: Insufficient documentation

## 2020-12-24 DIAGNOSIS — J40 Bronchitis, not specified as acute or chronic: Secondary | ICD-10-CM | POA: Diagnosis not present

## 2020-12-24 DIAGNOSIS — I10 Essential (primary) hypertension: Secondary | ICD-10-CM | POA: Diagnosis not present

## 2020-12-24 DIAGNOSIS — E039 Hypothyroidism, unspecified: Secondary | ICD-10-CM | POA: Insufficient documentation

## 2020-12-24 DIAGNOSIS — R059 Cough, unspecified: Secondary | ICD-10-CM | POA: Diagnosis present

## 2020-12-24 LAB — RESP PANEL BY RT-PCR (FLU A&B, COVID) ARPGX2
Influenza A by PCR: NEGATIVE
Influenza B by PCR: NEGATIVE
SARS Coronavirus 2 by RT PCR: NEGATIVE

## 2020-12-24 LAB — GROUP A STREP BY PCR: Group A Strep by PCR: NOT DETECTED

## 2020-12-24 MED ORDER — PREDNISONE 10 MG (21) PO TBPK: ORAL_TABLET | Freq: Every day | ORAL | 0 refills | Status: AC

## 2020-12-24 MED ORDER — IPRATROPIUM-ALBUTEROL 0.5-2.5 (3) MG/3ML IN SOLN
3.0000 mL | Freq: Once | RESPIRATORY_TRACT | Status: AC
  Administered 2020-12-24: 3 mL via RESPIRATORY_TRACT
  Filled 2020-12-24: qty 3

## 2020-12-24 MED ORDER — ONDANSETRON 4 MG PO TBDP: 4.0000 mg | ORAL_TABLET | Freq: Three times a day (TID) | ORAL | 0 refills | Status: AC | PRN

## 2020-12-24 MED ORDER — BENZONATATE 100 MG PO CAPS
200.0000 mg | ORAL_CAPSULE | Freq: Once | ORAL | Status: AC
  Administered 2020-12-24: 200 mg via ORAL
  Filled 2020-12-24: qty 2

## 2020-12-24 MED ORDER — BENZONATATE 100 MG PO CAPS: 100.0000 mg | ORAL_CAPSULE | Freq: Three times a day (TID) | ORAL | 0 refills | Status: AC

## 2020-12-24 MED ORDER — ALBUTEROL SULFATE HFA 108 (90 BASE) MCG/ACT IN AERS: 1.0000 | INHALATION_SPRAY | Freq: Four times a day (QID) | RESPIRATORY_TRACT | 0 refills | Status: AC | PRN

## 2020-12-24 NOTE — ED Provider Notes (Signed)
[facility] EMERGENCY DEPT Provider Note   CSN: [id number] Arrival date & time: 12/24/20  1258     History Chief Complaint  Patient presents with   Fever    Julia Hubbard is a 65 y.o. female who presents to the emergency department with a 5-day history of cough and fever.  Patient denies any sick contacts at home.  She is having worsening cough and shortness of breath.  Fever is intermittently improved with Tylenol.  She reports associated posttussive emesis and loss of voice.  Today, patient was feeling worse had a fever of 101 and called EMS.  Tylenol was given prior to arrival which improved her fever.  She denies any chest pain, abdominal pain, nausea, and vomiting.   Fever     Past Medical History:  Diagnosis Date   Depression    Heart murmur    Hyperlipidemia    Hypertension    Hypertriglyceridemia    Hypothyroidism    Numbness and tingling in hands    during sleep and laying down   Prediabetes     There are no problems to display for this patient.   Past Surgical History:  Procedure Laterality Date   COLONOSCOPY     TONSILLECTOMY  01/21/1975     OB History   No obstetric history on file.     Family History  Problem Relation Age of Onset   Colon cancer Father    Breast cancer Maternal Aunt    Colon cancer Paternal Grandfather    Stomach cancer Neg Hx    Rectal cancer Neg Hx    Esophageal cancer Neg Hx     Social History   Tobacco Use   Smoking status: Never   Smokeless tobacco: Never  Vaping Use   Vaping Use: Never used  Substance Use Topics   Alcohol use: Yes    Alcohol/week: 1.0 standard drink    Types: 1 Glasses of wine per week    Comment: social    Drug use: No    Home Medications Prior to Admission medications   Medication Sig Start Date End Date Taking? Authorizing Provider  albuterol (VENTOLIN HFA) 108 (90 Base) MCG/ACT inhaler Inhale 1-2 puffs into the lungs every 6 (six) hours as needed for wheezing or  shortness of breath. 12/24/20  Yes Raul Del, Berlie Hatchel M, PA-C  benzonatate (TESSALON) 100 MG capsule Take 1 capsule (100 mg total) by mouth every 8 (eight) hours. 12/24/20  Yes Raul Del, Samba Cumba M, PA-C  ondansetron (ZOFRAN-ODT) 4 MG disintegrating tablet Take 1 tablet (4 mg total) by mouth every 8 (eight) hours as needed for nausea or vomiting. 12/24/20  Yes Raul Del, Manuela Halbur M, PA-C  predniSONE (STERAPRED UNI-PAK 21 TAB) 10 MG (21) TBPK tablet Take by mouth daily. Take 6 tabs by mouth daily  for 2 days, then 5 tabs for 2 days, then 4 tabs for 2 days, then 3 tabs for 2 days, 2 tabs for 2 days, then 1 tab by mouth daily for 2 days 12/24/20  Yes Raul Del, Berdell Nevitt M, PA-C  cholecalciferol (VITAMIN D) 1000 UNITS tablet Take 1,000 Units by mouth daily.    [provider]  levothyroxine (SYNTHROID) 50 MCG tablet Take 75 mcg by mouth daily at 12 noon. 01/31/16   [provider]  lisinopril-hydrochlorothiazide (ZESTORETIC) 20-25 MG tablet Take 1 tablet by mouth daily. 03/21/14   [provider]  metoprolol succinate (TOPROL XL) 25 MG 24 hr tablet Take 1 tablet (25 mg total) by mouth every morning.  08/24/20 11/22/20  Tolia, Sunit, DO  Multiple Vitamin (MULTIVITAMIN) tablet Take 1 tablet by mouth daily.    [provider]  rosuvastatin (CRESTOR) 20 MG tablet Take 1 tablet by mouth daily at 12 noon.    [provider]  venlafaxine XR (EFFEXOR-XR) 150 MG 24 hr capsule Take 150 mg by mouth daily with breakfast.    [provider]    Allergies    Justicia adhatoda (malabar nut tree) [justicia adhatoda] and Peanut-containing drug products  Review of Systems   Review of Systems  Constitutional:  Positive for fever.  All other systems reviewed and are negative.  Physical Exam Updated Vital Signs BP (!) 121/99   Pulse 75   Temp 98.2 F (36.8 C)   Resp 16   Ht 5\' 8"  (1.727 m)   Wt 89.4 kg   SpO2 98%   BMI 29.95 kg/m   Physical Exam Vitals and nursing note reviewed.   Constitutional:      General: She is not in acute distress.    Appearance: Normal appearance.  HENT:     Head: Normocephalic and atraumatic.  Eyes:     General:        Right eye: No discharge.        Left eye: No discharge.  Cardiovascular:     Comments: Regular rate and rhythm.  S1/S2 are distinct without any evidence of murmur, rubs, or gallops.  Radial pulses are 2+ bilaterally.  Dorsalis pedis pulses are 2+ bilaterally.  No evidence of pedal edema. Pulmonary:     Comments: Normal effort.  No respiratory distress.  Expiratory wheeze heard throughout all lung fields. Abdominal:     General: Abdomen is flat. Bowel sounds are normal. There is no distension.     Tenderness: There is no abdominal tenderness. There is no guarding or rebound.  Musculoskeletal:        General: Normal range of motion.     Cervical back: Neck supple.  Skin:    General: Skin is warm and dry.     Findings: No rash.  Neurological:     General: No focal deficit present.     Mental Status: She is alert.  Psychiatric:        Mood and Affect: Mood normal.        Behavior: Behavior normal.    ED Results / Procedures / Treatments   Labs (all labs ordered are listed, but only abnormal results are displayed) Labs Reviewed  RESP PANEL BY RT-PCR (FLU A&B, COVID) [id number]  GROUP A STREP BY PCR    EKG EKG Interpretation  Date/Time:  Monday December 24 2020 16:17:54 EST Ventricular Rate:  67 PR Interval:  157 QRS Duration: 105 QT Interval:  419 QTC Calculation: 443 R Axis:   42 Text Interpretation: Sinus rhythm Normal ECG No significant change since last tracing Confirmed by Calvert Cantor [id number]) on 12/24/2020 4:30:00 PM  Radiology DG Chest 2 View  Result Date: 12/24/2020 CLINICAL DATA:  Cough, chills and fever EXAM: CHEST - 2 VIEW COMPARISON:  None. FINDINGS: There is central bronchial thickening but no infiltrate, collapse or effusion. No significant bone finding. Heart size is normal. Mediastinal  shadows are normal. IMPRESSION: Bronchitis pattern.  No consolidation or collapse Electronically Signed   By: Nelson Chimes M.D.   On: 12/24/2020 14:21    Procedures Procedures   Medications Ordered in ED Medications  ipratropium-albuterol (DUONEB) 0.5-2.5 (3) MG/3ML nebulizer solution 3 mL (3 mLs Nebulization Given 12/24/20 1626)  benzonatate (TESSALON) capsule 200 mg (200 mg Oral Given 12/24/20 1623)    ED Course  I have reviewed the triage vital signs and the nursing notes.  Pertinent labs & imaging results that were available during my care of the patient were reviewed by me and considered in my medical decision making (see chart for details).    MDM Rules/Calculators/A&P                          Julia Hubbard is a 65 y.o. female who presents the emergency department for evaluation of cough and fever.  Patient is in no respiratory distress at the moment and is afebrile.  I will give her a DuoNeb for her wheezing.  She was negative for COVID and flu.  Chest x-ray showed bronchitis.  Will likely discharge her home with an albuterol inhaler, steroids, and Tessalon Perles.  On reevaluation, patient is feeling a little better.  On auscultation her wheezing has improved.  Still having slight diffuse expiratory wheeze.  I will give her a short course of prednisone, Zofran for nausea, Tessalon for cough, and albuterol inhaler.  Patient was amenable to this plan.  I will have her follow-up with her primary doctor.  Strict turn precautions given.  She is in no respiratory distress and safe for discharge.   Final Clinical Impression(s) / ED Diagnoses Final diagnoses:  Bronchitis    Rx / DC Orders ED Discharge Orders          Ordered    albuterol (VENTOLIN HFA) 108 (90 Base) MCG/ACT inhaler  Every 6 hours PRN        12/24/20 1658    benzonatate (TESSALON) 100 MG capsule  Every 8 hours        12/24/20 1658    predniSONE (STERAPRED UNI-PAK 21 TAB) 10 MG (21) TBPK tablet  Daily         12/24/20 1658    ondansetron (ZOFRAN-ODT) 4 MG disintegrating tablet  Every 8 hours PRN        12/24/20 1658             Myna Bright [place], [place] 12/24/20 1700    Truddie Hidden, MD 12/24/20 [phone]

## 2020-12-24 NOTE — Discharge Instructions (Addendum)
Please take Zofran every 8 hours as needed for nausea.  Please take prednisone taper as prescribed on box.  Albuterol inhaler as needed for wheezing and chest tightness.  Tessalon for cough.  Please follow-up with your family doctor.  Please turn to the emergency permit sooner if you experience worsening symptoms, trouble breathing, severe chest pain, intractable vomiting/diarrhea, or any other concerns you may have.

## 2020-12-24 NOTE — ED Triage Notes (Signed)
Pt arrives to ED with c/o flu like symptoms. Pt reporters she has chills, fever, sore throat, nausea, diarrhea, cough, congestion x5 days. She reports that today she started to feel dizzy. Her highest temperature today was 101 at home.

## 2021-01-28 DIAGNOSIS — I10 Essential (primary) hypertension: Secondary | ICD-10-CM | POA: Diagnosis not present

## 2021-01-28 DIAGNOSIS — E039 Hypothyroidism, unspecified: Secondary | ICD-10-CM | POA: Diagnosis not present

## 2021-01-28 DIAGNOSIS — Z23 Encounter for immunization: Secondary | ICD-10-CM | POA: Diagnosis not present

## 2021-01-30 DIAGNOSIS — I491 Atrial premature depolarization: Secondary | ICD-10-CM | POA: Diagnosis not present

## 2021-01-30 DIAGNOSIS — I10 Essential (primary) hypertension: Secondary | ICD-10-CM | POA: Diagnosis not present

## 2021-01-30 DIAGNOSIS — E663 Overweight: Secondary | ICD-10-CM | POA: Diagnosis not present

## 2021-01-30 DIAGNOSIS — E039 Hypothyroidism, unspecified: Secondary | ICD-10-CM | POA: Diagnosis not present

## 2021-04-03 DIAGNOSIS — M1712 Unilateral primary osteoarthritis, left knee: Secondary | ICD-10-CM | POA: Diagnosis not present

## 2021-04-03 DIAGNOSIS — M170 Bilateral primary osteoarthritis of knee: Secondary | ICD-10-CM | POA: Diagnosis not present

## 2021-04-03 DIAGNOSIS — L309 Dermatitis, unspecified: Secondary | ICD-10-CM | POA: Diagnosis not present

## 2021-05-23 DIAGNOSIS — Z0000 Encounter for general adult medical examination without abnormal findings: Secondary | ICD-10-CM | POA: Diagnosis not present

## 2021-05-23 DIAGNOSIS — Z114 Encounter for screening for human immunodeficiency virus [HIV]: Secondary | ICD-10-CM | POA: Diagnosis not present

## 2021-07-31 DIAGNOSIS — J069 Acute upper respiratory infection, unspecified: Secondary | ICD-10-CM | POA: Diagnosis not present

## 2021-07-31 DIAGNOSIS — J309 Allergic rhinitis, unspecified: Secondary | ICD-10-CM | POA: Diagnosis not present

## 2021-07-31 DIAGNOSIS — J329 Chronic sinusitis, unspecified: Secondary | ICD-10-CM | POA: Diagnosis not present

## 2021-08-08 DIAGNOSIS — Z23 Encounter for immunization: Secondary | ICD-10-CM | POA: Diagnosis not present

## 2021-08-08 DIAGNOSIS — E785 Hyperlipidemia, unspecified: Secondary | ICD-10-CM | POA: Diagnosis not present

## 2021-08-08 DIAGNOSIS — Z0000 Encounter for general adult medical examination without abnormal findings: Secondary | ICD-10-CM | POA: Diagnosis not present

## 2022-01-29 DIAGNOSIS — R7303 Prediabetes: Secondary | ICD-10-CM | POA: Diagnosis not present

## 2022-01-29 DIAGNOSIS — M170 Bilateral primary osteoarthritis of knee: Secondary | ICD-10-CM | POA: Diagnosis not present

## 2022-01-29 DIAGNOSIS — M25561 Pain in right knee: Secondary | ICD-10-CM | POA: Diagnosis not present

## 2022-01-30 DIAGNOSIS — M1711 Unilateral primary osteoarthritis, right knee: Secondary | ICD-10-CM | POA: Diagnosis not present

## 2022-01-30 DIAGNOSIS — M1712 Unilateral primary osteoarthritis, left knee: Secondary | ICD-10-CM | POA: Diagnosis not present

## 2022-01-30 DIAGNOSIS — M25461 Effusion, right knee: Secondary | ICD-10-CM | POA: Diagnosis not present

## 2022-02-06 DIAGNOSIS — R7303 Prediabetes: Secondary | ICD-10-CM | POA: Diagnosis not present

## 2022-02-06 DIAGNOSIS — E039 Hypothyroidism, unspecified: Secondary | ICD-10-CM | POA: Diagnosis not present

## 2022-02-06 DIAGNOSIS — E782 Mixed hyperlipidemia: Secondary | ICD-10-CM | POA: Diagnosis not present

## 2022-02-06 DIAGNOSIS — I10 Essential (primary) hypertension: Secondary | ICD-10-CM | POA: Diagnosis not present

## 2022-03-07 ENCOUNTER — Other Ambulatory Visit: Payer: Self-pay | Admitting: Family Medicine

## 2022-03-07 DIAGNOSIS — Z1231 Encounter for screening mammogram for malignant neoplasm of breast: Secondary | ICD-10-CM

## 2022-03-12 DIAGNOSIS — M1710 Unilateral primary osteoarthritis, unspecified knee: Secondary | ICD-10-CM | POA: Diagnosis not present

## 2022-06-12 ENCOUNTER — Ambulatory Visit
Admission: RE | Admit: 2022-06-12 | Discharge: 2022-06-12 | Disposition: A | Discharge status: Home / Self Care | Payer: BC Managed Care – PPO | Source: Ambulatory Visit | Attending: Family Medicine | Admitting: Family Medicine

## 2022-06-12 DIAGNOSIS — Z1231 Encounter for screening mammogram for malignant neoplasm of breast: Secondary | ICD-10-CM | POA: Diagnosis not present

## 2022-08-07 DIAGNOSIS — E039 Hypothyroidism, unspecified: Secondary | ICD-10-CM | POA: Diagnosis not present

## 2022-08-07 DIAGNOSIS — I10 Essential (primary) hypertension: Secondary | ICD-10-CM | POA: Diagnosis not present

## 2022-08-07 DIAGNOSIS — R7303 Prediabetes: Secondary | ICD-10-CM | POA: Diagnosis not present

## 2022-08-14 DIAGNOSIS — E782 Mixed hyperlipidemia: Secondary | ICD-10-CM | POA: Diagnosis not present

## 2022-08-14 DIAGNOSIS — E039 Hypothyroidism, unspecified: Secondary | ICD-10-CM | POA: Diagnosis not present

## 2022-08-14 DIAGNOSIS — N760 Acute vaginitis: Secondary | ICD-10-CM | POA: Diagnosis not present

## 2022-08-14 DIAGNOSIS — R7303 Prediabetes: Secondary | ICD-10-CM | POA: Diagnosis not present

## 2022-08-14 DIAGNOSIS — Z789 Other specified health status: Secondary | ICD-10-CM | POA: Diagnosis not present

## 2022-08-31 DIAGNOSIS — J9809 Other diseases of bronchus, not elsewhere classified: Secondary | ICD-10-CM | POA: Diagnosis not present

## 2022-08-31 DIAGNOSIS — R0981 Nasal congestion: Secondary | ICD-10-CM | POA: Diagnosis not present

## 2022-08-31 DIAGNOSIS — R112 Nausea with vomiting, unspecified: Secondary | ICD-10-CM | POA: Diagnosis not present

## 2022-08-31 DIAGNOSIS — J101 Influenza due to other identified influenza virus with other respiratory manifestations: Secondary | ICD-10-CM | POA: Diagnosis not present

## 2022-08-31 DIAGNOSIS — R062 Wheezing: Secondary | ICD-10-CM | POA: Diagnosis not present

## 2022-08-31 DIAGNOSIS — R509 Fever, unspecified: Secondary | ICD-10-CM | POA: Diagnosis not present

## 2022-08-31 DIAGNOSIS — Z1330 Encounter for screening examination for mental health and behavioral disorders, unspecified: Secondary | ICD-10-CM | POA: Diagnosis not present

## 2022-08-31 DIAGNOSIS — J208 Acute bronchitis due to other specified organisms: Secondary | ICD-10-CM | POA: Diagnosis not present

## 2022-09-04 DIAGNOSIS — J111 Influenza due to unidentified influenza virus with other respiratory manifestations: Secondary | ICD-10-CM | POA: Diagnosis not present

## 2022-09-04 DIAGNOSIS — J329 Chronic sinusitis, unspecified: Secondary | ICD-10-CM | POA: Diagnosis not present

## 2022-10-03 DIAGNOSIS — E782 Mixed hyperlipidemia: Secondary | ICD-10-CM | POA: Diagnosis not present

## 2022-10-03 DIAGNOSIS — I10 Essential (primary) hypertension: Secondary | ICD-10-CM | POA: Diagnosis not present

## 2022-10-03 DIAGNOSIS — Z13220 Encounter for screening for lipoid disorders: Secondary | ICD-10-CM | POA: Diagnosis not present

## 2022-10-03 DIAGNOSIS — E039 Hypothyroidism, unspecified: Secondary | ICD-10-CM | POA: Diagnosis not present

## 2022-10-08 DIAGNOSIS — Z0000 Encounter for general adult medical examination without abnormal findings: Secondary | ICD-10-CM | POA: Diagnosis not present

## 2022-10-08 DIAGNOSIS — Z23 Encounter for immunization: Secondary | ICD-10-CM | POA: Diagnosis not present

## 2022-10-09 ENCOUNTER — Other Ambulatory Visit: Payer: Self-pay | Admitting: Family Medicine

## 2022-10-09 DIAGNOSIS — Z1231 Encounter for screening mammogram for malignant neoplasm of breast: Secondary | ICD-10-CM

## 2022-10-09 DIAGNOSIS — E559 Vitamin D deficiency, unspecified: Secondary | ICD-10-CM

## 2023-04-28 ENCOUNTER — Ambulatory Visit
Admission: RE | Admit: 2023-04-28 | Discharge: 2023-04-28 | Disposition: A | Discharge status: Home / Self Care | Payer: BC Managed Care – PPO | Source: Ambulatory Visit | Attending: Family Medicine | Admitting: Family Medicine

## 2023-04-28 DIAGNOSIS — E559 Vitamin D deficiency, unspecified: Secondary | ICD-10-CM

## 2023-06-18 ENCOUNTER — Ambulatory Visit
Admission: RE | Admit: 2023-06-18 | Discharge: 2023-06-18 | Disposition: A | Discharge status: Home / Self Care | Payer: BC Managed Care – PPO | Source: Ambulatory Visit | Attending: Family Medicine | Admitting: Family Medicine

## 2023-06-18 DIAGNOSIS — Z1231 Encounter for screening mammogram for malignant neoplasm of breast: Secondary | ICD-10-CM
